# Patient Record
Sex: Female | Born: 1980 | Race: Black or African American | Hispanic: No | Marital: Married | Smoking: Never smoker
Health system: Southern US, Community
[De-identification: ages and names within clinical notes are randomized; demographics above are authoritative.]

## PROBLEM LIST (undated history)

## (undated) ENCOUNTER — Inpatient Hospital Stay (HOSPITAL_COMMUNITY): Payer: Self-pay

## (undated) DIAGNOSIS — O24419 Gestational diabetes mellitus in pregnancy, unspecified control: Secondary | ICD-10-CM

## (undated) HISTORY — PX: NO PAST SURGERIES: SHX2092

## (undated) HISTORY — DX: Gestational diabetes mellitus in pregnancy, unspecified control: O24.419

---

## 2012-12-02 ENCOUNTER — Encounter (HOSPITAL_COMMUNITY): Payer: Self-pay | Admitting: *Deleted

## 2012-12-02 ENCOUNTER — Emergency Department (HOSPITAL_COMMUNITY)
Admission: EM | Admit: 2012-12-02 | Discharge: 2012-12-02 | Disposition: A | Discharge status: Home / Self Care | Payer: Medicaid Other | Attending: Emergency Medicine | Admitting: Emergency Medicine

## 2012-12-02 DIAGNOSIS — N898 Other specified noninflammatory disorders of vagina: Secondary | ICD-10-CM | POA: Insufficient documentation

## 2012-12-02 DIAGNOSIS — N949 Unspecified condition associated with female genital organs and menstrual cycle: Secondary | ICD-10-CM | POA: Insufficient documentation

## 2012-12-02 DIAGNOSIS — K5900 Constipation, unspecified: Secondary | ICD-10-CM | POA: Insufficient documentation

## 2012-12-02 DIAGNOSIS — R109 Unspecified abdominal pain: Secondary | ICD-10-CM | POA: Insufficient documentation

## 2012-12-02 DIAGNOSIS — O2390 Unspecified genitourinary tract infection in pregnancy, unspecified trimester: Secondary | ICD-10-CM | POA: Insufficient documentation

## 2012-12-02 DIAGNOSIS — R300 Dysuria: Secondary | ICD-10-CM | POA: Insufficient documentation

## 2012-12-02 DIAGNOSIS — O9989 Other specified diseases and conditions complicating pregnancy, childbirth and the puerperium: Secondary | ICD-10-CM | POA: Insufficient documentation

## 2012-12-02 DIAGNOSIS — O2342 Unspecified infection of urinary tract in pregnancy, second trimester: Secondary | ICD-10-CM

## 2012-12-02 DIAGNOSIS — O219 Vomiting of pregnancy, unspecified: Secondary | ICD-10-CM | POA: Insufficient documentation

## 2012-12-02 DIAGNOSIS — N390 Urinary tract infection, site not specified: Secondary | ICD-10-CM | POA: Insufficient documentation

## 2012-12-02 LAB — URINALYSIS, MICROSCOPIC ONLY
Bilirubin Urine: NEGATIVE
Hgb urine dipstick: NEGATIVE
Protein, ur: 30 mg/dL — AB
Urobilinogen, UA: 0.2 mg/dL (ref 0.0–1.0)

## 2012-12-02 MED ORDER — NITROFURANTOIN MONOHYD MACRO 100 MG PO CAPS: 100.0000 mg | ORAL_CAPSULE | Freq: Two times a day (BID) | ORAL | Status: DC

## 2012-12-02 NOTE — ED Provider Notes (Signed)
Medical screening examination/treatment/procedure(s) were conducted as a shared visit with non-physician practitioner(s) and myself.  I personally evaluated the patient during the encounter.  G1P0 no vag bleed, constant non-crampy pain all day bilat lower abd at sides of uterus, minimally tender lower bilat uterus, RLQ and RUQ abd SNT, OBRR RN evaluated Pt, I discussed case with OB faculty for f/u either at health dept or Women's clinic.  Hurman Horn, MD 12/16/12 [phone]

## 2012-12-02 NOTE — Progress Notes (Signed)
Pt is a G1P0 (edc, by lmp, 7/29), 22wk 5da. FHT 160 by doppler, no contractions after monitoring x 1hr. Pt states scant amount of white discharge in the past, not leaking any fluid at this time, no bleeding. Will continue to assess for contractions. Describes primary pain in upper thighs, some back pain.

## 2012-12-02 NOTE — Progress Notes (Signed)
Pt confirms pcp is Kim Robertson updated

## 2012-12-02 NOTE — ED Notes (Signed)
Pt reports she is 5 months pregnant, no prenatal care. Pt reports feeling baby kick around 1200 today. Pt reports lower abdominal pain 8/10 that started this morning. Reports some whitish vaginal discharge last week. No discharge today. Denies n/v/d.

## 2012-12-02 NOTE — ED Notes (Signed)
Based on pt's LMP, pt is [redacted]w[redacted]d. OB RN called and en route.

## 2012-12-03 NOTE — ED Provider Notes (Signed)
History     CSN: [id number]  Arrival date & time 12/02/12  1654   First MD Initiated Contact with Patient 12/02/12 1747      Chief Complaint  Patient presents with  . 5 months preg, abdominal pain     (Consider location/radiation/quality/duration/timing/severity/associated sxs/prior treatment) HPI Comments: 32 y.o. Female G1P0 presents approx 5 months pregnant with no prenatal care complaining of minor abdominal pain that started this morning and vaginal discharge occurring over the past week.   OB Rapid Response Team evaluation included approximating gestation at 23 weeks, reassuring NST with fetal heart rate at 160. No contractions appreciated on strip.   Pt admits some nausea and constipation throughout her pregnancy, not worse today, and admits some mild burning with urination. States she has been feeling the baby move.  Denies fever, chills, vomiting, diarrhea, gush of fluids, bloody discharge, shortness of breath, or feeling contractions.   History reviewed. No pertinent past medical history.  No past surgical history on file.  No family history on file.  History  Substance Use Topics  . Smoking status: Never Smoker   . Smokeless tobacco: Not on file  . Alcohol Use: No    OB History   Grav Para Term Preterm Abortions TAB SAB Ect Mult Living   1               Review of Systems  Constitutional: Negative for fever, chills and diaphoresis.  HENT: Negative for neck pain and neck stiffness.   Eyes: Negative for visual disturbance.  Respiratory: Negative for apnea, chest tightness and shortness of breath.   Cardiovascular: Negative for chest pain and palpitations.  Gastrointestinal: Negative for nausea, vomiting, diarrhea and constipation.  Genitourinary: Positive for dysuria, vaginal discharge and pelvic pain. Negative for hematuria, flank pain, vaginal bleeding and vaginal pain.       Bilateral groin pain, minimal whitish discharge noted in underwear over the past  week  Musculoskeletal: Negative for gait problem.  Neurological: Negative for dizziness, weakness, light-headedness, numbness and headaches.    Allergies  Other  Home Medications   Current Outpatient Rx  Name  Route  Sig  Dispense  Refill  . Prenatal Vit-Fe Fumarate-FA (MULTIVITAMIN-PRENATAL) 27-0.8 MG TABS   Oral   Take 1 tablet by mouth daily at 12 noon.         . nitrofurantoin, macrocrystal-monohydrate, (MACROBID) 100 MG capsule   Oral   Take 1 capsule (100 mg total) by mouth 2 (two) times daily.   10 capsule   0     BP 118/64  Pulse 89  Temp(Src) 99.5 F (37.5 C) (Oral)  Resp 18  SpO2 98%  LMP 06/26/2012  Physical Exam  Nursing note and vitals reviewed. Constitutional: She is oriented to person, place, and time. She appears well-developed and well-nourished. No distress.  HENT:  Head: Normocephalic and atraumatic.  Eyes: Conjunctivae and EOM are normal.  Neck: Normal range of motion. Neck supple.  No meningeal signs  Cardiovascular: Normal rate, regular rhythm and normal heart sounds.  Exam reveals no gallop and no friction rub.   No murmur heard. Pulmonary/Chest: Effort normal and breath sounds normal. No respiratory distress. She has no wheezes. She has no rales. She exhibits no tenderness.  Abdominal: Soft. Bowel sounds are normal. She exhibits no distension. There is no tenderness. There is no rebound and no guarding.  Musculoskeletal: Normal range of motion. She exhibits no edema and no tenderness.  Neurological: She is alert and oriented to person,  place, and time. No cranial nerve deficit.  Skin: Skin is warm and dry. She is not diaphoretic. No erythema.  Psychiatric: She has a normal mood and affect.    ED Course  Procedures (including critical care time)  Labs Reviewed  URINALYSIS, MICROSCOPIC ONLY - Abnormal; Notable for the following:    APPearance CLOUDY (*)    Protein, ur 30 (*)    Leukocytes, UA LARGE (*)    Bacteria, UA FEW (*)     Squamous Epithelial / LPF FEW (*)    All other components within normal limits  URINE CULTURE   No results found. Medications - No data to display Discharge Medication List as of 12/02/2012  8:05 PM    START taking these medications   Details  nitrofurantoin, macrocrystal-monohydrate, (MACROBID) 100 MG capsule Take 1 capsule (100 mg total) by mouth 2 (two) times daily., Starting 12/02/2012, Until Discontinued, Print        1. Urinary tract infection during pregnancy, second trimester       MDM  Based on ROS and PE, likely typical ligamentous pain associated with second and third trimester pregnancy. Whitish discharge sounds typical of mucous discharge also associated with pregnancy. UA shows large leukocytes, will treat with Macrobid. OB Rapid Response Team findings reassuring. Pt also seen by Dr. Fonnie Jarvis who discussed case with OB faculty for f/u either at health dept or Women's clinic.  At this time there does not appear to be any evidence of an acute emergency medical condition and the patient appears stable for discharge with appropriate outpatient follow up. Diagnosis was discussed with patient who verbalizes understanding and is agreeable to discharge.    Glade Nurse, PA-C 12/03/12 1338

## 2012-12-04 LAB — URINE CULTURE
Colony Count: NO GROWTH
Culture: NO GROWTH

## 2012-12-09 ENCOUNTER — Encounter (HOSPITAL_COMMUNITY): Payer: Self-pay | Admitting: *Deleted

## 2012-12-09 ENCOUNTER — Inpatient Hospital Stay (HOSPITAL_COMMUNITY): Payer: Medicaid Other

## 2012-12-09 ENCOUNTER — Inpatient Hospital Stay (HOSPITAL_COMMUNITY)
Admission: AD | Admit: 2012-12-09 | Discharge: 2012-12-16 | DRG: 775 | Disposition: A | Discharge status: Home / Self Care | Payer: Medicaid Other | Source: Ambulatory Visit | Attending: Obstetrics and Gynecology | Admitting: Obstetrics and Gynecology

## 2012-12-09 DIAGNOSIS — O0933 Supervision of pregnancy with insufficient antenatal care, third trimester: Secondary | ICD-10-CM

## 2012-12-09 DIAGNOSIS — O42912 Preterm premature rupture of membranes, unspecified as to length of time between rupture and onset of labor, second trimester: Secondary | ICD-10-CM

## 2012-12-09 DIAGNOSIS — O0930 Supervision of pregnancy with insufficient antenatal care, unspecified trimester: Secondary | ICD-10-CM

## 2012-12-09 DIAGNOSIS — O4290 Premature rupture of membranes, unspecified as to length of time between rupture and onset of labor, unspecified weeks of gestation: Principal | ICD-10-CM | POA: Diagnosis present

## 2012-12-09 LAB — URINALYSIS, ROUTINE W REFLEX MICROSCOPIC
Bilirubin Urine: NEGATIVE
Glucose, UA: 500 mg/dL — AB
Specific Gravity, Urine: 1.025 (ref 1.005–1.030)
Urobilinogen, UA: 1 mg/dL (ref 0.0–1.0)

## 2012-12-09 LAB — URINE MICROSCOPIC-ADD ON

## 2012-12-09 LAB — WET PREP, GENITAL
Trich, Wet Prep: NONE SEEN
Yeast Wet Prep HPF POC: NONE SEEN

## 2012-12-09 LAB — DIFFERENTIAL
Basophils Absolute: 0 10*3/uL (ref 0.0–0.1)
Basophils Relative: 0 % (ref 0–1)
Eosinophils Relative: 1 % (ref 0–5)
Monocytes Absolute: 0.7 10*3/uL (ref 0.1–1.0)

## 2012-12-09 LAB — POCT FERN TEST: POCT Fern Test: POSITIVE

## 2012-12-09 LAB — CBC
HCT: 36 % (ref 36.0–46.0)
MCHC: 33.3 g/dL (ref 30.0–36.0)
MCV: 84.7 fL (ref 78.0–100.0)
RDW: 14.1 % (ref 11.5–15.5)

## 2012-12-09 LAB — TYPE AND SCREEN: Antibody Screen: NEGATIVE

## 2012-12-09 LAB — OB RESULTS CONSOLE ABO/RH

## 2012-12-09 LAB — ABO/RH: ABO/RH(D): B POS

## 2012-12-09 LAB — OB RESULTS CONSOLE GBS: GBS: NEGATIVE

## 2012-12-09 MED ORDER — ERYTHROMYCIN BASE 250 MG PO TABS
250.0000 mg | ORAL_TABLET | Freq: Four times a day (QID) | ORAL | Status: DC
  Administered 2012-12-11 – 2012-12-12 (×3): 250 mg via ORAL
  Filled 2012-12-09 (×4): qty 1

## 2012-12-09 MED ORDER — PRENATAL MULTIVITAMIN CH
1.0000 | ORAL_TABLET | Freq: Every day | ORAL | Status: DC
  Administered 2012-12-10 – 2012-12-13 (×3): 1 via ORAL
  Filled 2012-12-09 (×3): qty 1

## 2012-12-09 MED ORDER — SODIUM CHLORIDE 0.9 % IV SOLN: 250.0000 mL | INTRAVENOUS | Status: DC | PRN

## 2012-12-09 MED ORDER — SODIUM CHLORIDE 0.9 % IV SOLN
2.0000 g | Freq: Four times a day (QID) | INTRAVENOUS | Status: AC
  Administered 2012-12-09 – 2012-12-11 (×8): 2 g via INTRAVENOUS
  Filled 2012-12-09 (×8): qty 2000

## 2012-12-09 MED ORDER — DOCUSATE SODIUM 100 MG PO CAPS
100.0000 mg | ORAL_CAPSULE | Freq: Every day | ORAL | Status: DC
  Administered 2012-12-10: 100 mg via ORAL
  Filled 2012-12-09 (×2): qty 1

## 2012-12-09 MED ORDER — BETAMETHASONE SOD PHOS & ACET 6 (3-3) MG/ML IJ SUSP
12.0000 mg | INTRAMUSCULAR | Status: AC
  Administered 2012-12-09 – 2012-12-10 (×2): 12 mg via INTRAMUSCULAR
  Filled 2012-12-09 (×2): qty 2

## 2012-12-09 MED ORDER — SODIUM CHLORIDE 0.9 % IJ SOLN: 3.0000 mL | Freq: Two times a day (BID) | INTRAMUSCULAR | Status: DC

## 2012-12-09 MED ORDER — ZOLPIDEM TARTRATE 5 MG PO TABS: 5.0000 mg | ORAL_TABLET | Freq: Every evening | ORAL | Status: DC | PRN

## 2012-12-09 MED ORDER — CALCIUM CARBONATE ANTACID 500 MG PO CHEW: 2.0000 | CHEWABLE_TABLET | ORAL | Status: DC | PRN

## 2012-12-09 MED ORDER — ACETAMINOPHEN 325 MG PO TABS: 650.0000 mg | ORAL_TABLET | ORAL | Status: DC | PRN

## 2012-12-09 MED ORDER — SODIUM CHLORIDE 0.9 % IJ SOLN: 3.0000 mL | INTRAMUSCULAR | Status: DC | PRN

## 2012-12-09 MED ORDER — SODIUM CHLORIDE 0.9 % IV SOLN
250.0000 mg | Freq: Four times a day (QID) | INTRAVENOUS | Status: AC
  Administered 2012-12-09 – 2012-12-11 (×8): 250 mg via INTRAVENOUS
  Filled 2012-12-09 (×8): qty 250

## 2012-12-09 MED ORDER — LACTATED RINGERS IV SOLN
INTRAVENOUS | Status: DC
  Administered 2012-12-09 – 2012-12-12 (×3): via INTRAVENOUS

## 2012-12-09 MED ORDER — AMOXICILLIN 500 MG PO CAPS
500.0000 mg | ORAL_CAPSULE | Freq: Three times a day (TID) | ORAL | Status: DC
  Administered 2012-12-11 – 2012-12-12 (×2): 500 mg via ORAL
  Filled 2012-12-09 (×3): qty 1

## 2012-12-09 NOTE — Progress Notes (Signed)
UR completed 

## 2012-12-09 NOTE — H&P (Signed)
Attestation of Attending Supervision of Advanced Practitioner (CNM/NP): Evaluation and management procedures were performed by the Advanced Practitioner under my supervision and collaboration.  I have reviewed the Advanced Practitioner's note and chart, and I agree with the management and plan.  HARRAWAY-SMITH, Seydina Holliman 7:10 PM

## 2012-12-09 NOTE — Consult Note (Signed)
Asked by Dr. Erin Fulling to provide prenatal consultation for patient at risk for preterm delivery due to PPROM.  Mother is 32 y.o. G1 who is now 74 6/[redacted]  weeks EGA by LMP, but she has not had prenatal care and [place] today suggests [redacted] wks EGA.  She was admitted today after PROM at home with clear fluid.  No fever or other Sx of infection (labs pending).  She is being treated with betamethasone and antibiotics (ampicillin and erythromycin) .  Discussed with patient and her husband the usual expectations for preterm infant at 56 - [redacted] weeks gestation, including possible needs for DR resuscitation, respiratory support, IV access, and blood products.  Also presented risks of death or serious morbidity, including neurodevelopmental disability.  Projected possible length of stay in NICU until 36 - [redacted] wks EGA.  Discussed advantages of feeding with mother's milk; she plans to pump postnatally and breast feed.  Patient was attentive, had appropriate questions, and was appreciative of my input.  Thank you for the consultation.

## 2012-12-09 NOTE — MAU Note (Signed)
?  SROM this morning around 1100, had gush of clear fluid, continues to trickle now.  Denies bleeding.  Pelvic pain, intermittent.

## 2012-12-09 NOTE — Progress Notes (Signed)
History     CSN: [id number]  Arrival date & time 12/09/12  1210   None     Chief Complaint  Patient presents with  . Rupture of Membranes  . Pelvic Pain    HPI Presenting for leaking of  Fluid at 11:00. Felt fluid coming out while sitting in a chair. Pt states it was about a handful. Watery and clear. Denies recent vaginal discharge. Last intercourse last Saturday. Denies vagnial bleeding/discharge, contractions, RUQ pain, HA, LE swelling, dysuria, N/V/D/C, lightheadedness, syncope.  No prenatal care to date. Has appt with Dr. Gaynell Face coming up. First pregnancy.     Past Medical History  Diagnosis Date  . Medical history non-contributory     Past Surgical History  Procedure Laterality Date  . No past surgeries      History reviewed. No pertinent family history.  History  Substance Use Topics  . Smoking status: Never Smoker   . Smokeless tobacco: Not on file  . Alcohol Use: No    OB History   Grav Para Term Preterm Abortions TAB SAB Ect Mult Living   1               Review of Systems  Constitutional: Negative for fever and activity change.  Respiratory: Negative for shortness of breath.   Cardiovascular: Negative for chest pain.  Gastrointestinal: Negative for nausea, vomiting, abdominal pain, diarrhea and constipation.  Genitourinary: Negative for dysuria, vaginal bleeding, difficulty urinating and vaginal pain.  Skin: Negative for rash.  Neurological: Positive for headaches. Negative for light-headedness.  All other systems reviewed and are negative.    Allergies  Other  Home Medications  No current outpatient prescriptions on file.  BP 115/61  Pulse 80  Temp(Src) 97.2 F (36.2 C) (Oral)  Resp 18  LMP 06/26/2012  Physical Exam  Constitutional: She is oriented to person, place, and time. She appears well-developed and well-nourished. No distress.  HENT:  Head: Normocephalic.  Eyes: Conjunctivae and EOM are normal.  Neck: Normal range of  motion.  Cardiovascular: Normal rate.   Pulmonary/Chest: Effort normal.  Abdominal: Soft.  Genitourinary: Vagina normal.  Pooling present on speculum exam. Cervix closed  Musculoskeletal: Normal range of motion.  Neurological: She is alert and oriented to person, place, and time.  Skin: Skin is warm and dry. She is not diaphoretic.  Psychiatric: She has a normal mood and affect. Her behavior is normal.    MAU Course  Procedures (including critical care time)  Labs Reviewed  URINALYSIS, ROUTINE W REFLEX MICROSCOPIC - Abnormal; Notable for the following:    APPearance HAZY (*)    Glucose, UA 500 (*)    Hgb urine dipstick TRACE (*)    Leukocytes, UA SMALL (*)    All other components within normal limits  URINE MICROSCOPIC-ADD ON - Abnormal; Notable for the following:    Squamous Epithelial / LPF FEW (*)    All other components within normal limits   US Ob Comp + 14 Wk  12/09/2012  OBSTETRICAL ULTRASOUND: This exam was performed within a [facility]. The OB US report was generated in the [facility] system, and faxed to the ordering physician.   This report is also available in [employer] and in the [employer]. See [facility] Obstetric US report.   Cephalic, AFI 8.54, Cervix 2.5 cm, placenta fundal, GA 27.0 weeks.  1. Preterm premature rupture of membranes in second trimester       MDM  32yo G1 at 23.6wks  presenting for concern for ROM. No PNC but has appt w/ Dr. Gaynell Face on Monday. No contractions on the monitor and FHT reassuring. - Cervical check  for ferning - wet prep, GC, Chl sent   ---------------------------------- Update  Ferning present on microscopic examination.  Reconfirmed dating Admit for steroids and [place] for dating, fluid levels, and anatomy    I saw and examined patient and agree with resident note. See History and Physical today's date. Napoleon Form, MD

## 2012-12-09 NOTE — H&P (Signed)
Kim Robertson is a 32 y.o. G1P0 at [redacted]w[redacted]d admitted for PPROM    Fetal presentation is unsure.  History of Present Illness: Presenting for leaking of Fluid at 11:00 am. Felt fluid coming out while sitting in a chair. Pt states it was about a handful. Thick and clear. Denies recent vaginal discharge. Last intercourse last Saturday. Denies vagnial bleeding/discharge, contractions, RUQ pain, HA, LE swelling, dysuria, N/V/D/C, lightheadedness, syncope.  Patient reports the fetal movement as active. Patient reports uterine contraction  activity as none. Patient reports  vaginal bleeding as none. Patient describes fluid per vagina as Clear.  Patient has had no prenatal care to date. She is scheduled to see Dr. Gaynell Face. First pregnancy, no OB history. LMP last week of October, ended before Halloween but not sure of exact dates. Periods regular, usually last 3-4 days.  Past Medical History: Past Medical History  Diagnosis Date  . Medical history non-contributory     Past Surgical History: Past Surgical History  Procedure Laterality Date  . No past surgeries      Obstetrical History: OB History   Grav Para Term Preterm Abortions TAB SAB Ect Mult Living   1              Gynecological History: negative  Social History: History   Social History  . Marital Status: Married    Spouse Name: N/A    Number of Children: N/A  . Years of Education: N/A   Social History Main Topics  . Smoking status: Never Smoker   . Smokeless tobacco: None  . Alcohol Use: No  . Drug Use: No  . Sexually Active: Yes   Other Topics Concern  . None   Social History Narrative  . None    Family History: History reviewed. No pertinent family history.  Allergies: Allergies  Allergen Reactions  . Other     Nasal spray.  Reaction unknown.   . Neosporin (Neomycin-Bacitracin Zn-Polymyx) Rash    Prescriptions prior to admission  Medication Sig Dispense Refill  . acetaminophen (TYLENOL) 500 MG  tablet Take 1,000 mg by mouth every 6 (six) hours as needed for pain.      . Prenatal Vit-Fe Fumarate-FA (MULTIVITAMIN-PRENATAL) 27-0.8 MG TABS Take 1 tablet by mouth daily at 12 noon.        Review of Systems  Constitutional: Negative for fever and activity change.  Respiratory: Negative for shortness of breath.  Cardiovascular: Negative for chest pain.  Gastrointestinal: Negative for nausea, vomiting, abdominal pain, diarrhea and constipation.  Genitourinary: Negative for dysuria, vaginal bleeding, difficulty urinating and vaginal pain.  Skin: Negative for rash.  Neurological: Positive for headaches. Negative for light-headedness.  All other systems reviewed and are negative.   Vitals:  BP 115/61  Pulse 80  Temp(Src) 97.2 F (36.2 C) (Oral)  Resp 18  LMP 06/26/2012 Physical Examination: Constitutional: She is oriented to person, place, and time. She appears well-developed and well-nourished. No distress.  HENT:  Head: Normocephalic.  Eyes: Conjunctivae and EOM are normal.  Neck: Normal range of motion.  Cardiovascular: Normal rate.  Pulmonary/Chest: Effort normal.  Abdominal: Soft.  Genitourinary: Vagina normal.  Pooling present on speculum exam. Cervix closed  Musculoskeletal: Normal range of motion.  Neurological: She is alert and oriented to person, place, and time.  Skin: Skin is warm and dry. She is not diaphoretic.  Psychiatric: She has a normal mood and affect. Her behavior is normal Membranes:sterile speculum exam clear pooling in the vagina w/ a closed cervix, no  lesions or purulent discharge Fetal Monitoring:Baseline: 140 bpm, Variability: Good {> 6 bpm), Accelerations: Reactive and Decelerations: Absent   Labs:  Results for orders placed during the hospital encounter of 12/09/12 (from the past 24 hour(s))  URINALYSIS, ROUTINE W REFLEX MICROSCOPIC   Collection Time    12/09/12 12:30 PM      Result Value Range   Color, Urine YELLOW  YELLOW   APPearance HAZY  (*) CLEAR   Specific Gravity, Urine 1.025  1.005 - 1.030   pH 6.5  5.0 - 8.0   Glucose, UA 500 (*) NEGATIVE mg/dL   Hgb urine dipstick TRACE (*) NEGATIVE   Bilirubin Urine NEGATIVE  NEGATIVE   Ketones, ur NEGATIVE  NEGATIVE mg/dL   Protein, ur NEGATIVE  NEGATIVE mg/dL   Urobilinogen, UA 1.0  0.0 - 1.0 mg/dL   Nitrite NEGATIVE  NEGATIVE   Leukocytes, UA SMALL (*) NEGATIVE  URINE MICROSCOPIC-ADD ON   Collection Time    12/09/12 12:30 PM      Result Value Range   Squamous Epithelial / LPF FEW (*) RARE   WBC, UA 3-6  <3 WBC/hpf   RBC / HPF 0-2  <3 RBC/hpf   Bacteria, UA RARE  RARE  WET PREP, GENITAL   Collection Time    12/09/12  1:30 PM      Result Value Range   Yeast Wet Prep HPF POC NONE SEEN  NONE SEEN   Trich, Wet Prep NONE SEEN  NONE SEEN   Clue Cells Wet Prep HPF POC NONE SEEN  NONE SEEN   WBC, Wet Prep HPF POC MODERATE (*) NONE SEEN  POCT FERN TEST   Collection Time    12/09/12  2:07 PM      Result Value Range   POCT Fern Test Positive = ruptured amniotic membanes      Imaging Studies:      . ampicillin (OMNIPEN) IV  2 g Intravenous Q6H   Followed by  . [START ON 12/11/2012] amoxicillin  500 mg Oral Q8H  . betamethasone acetate-betamethasone sodium phosphate  12 mg Intramuscular Q24H  . docusate sodium  100 mg Oral Daily  . erythromycin  250 mg Intravenous Q6H   Followed by  . [START ON 12/11/2012] erythromycin  250 mg Oral Q6H  . prenatal multivitamin  1 tablet Oral Q1200  . sodium chloride  3 mL Intravenous Q12H   I have reviewed the patient's current medications.  ASSESSMENT: 32yo G1 at 23.6wks by LMP presenting w/ PPROM adn no PNC. No contractions on the monitor and FHT reassuring. - Admit to Antenatal - Betamethasone - Amp and Erithro - Prenatal labs ordered - NICU consult - Discussed risks of preterm labor with patient, understands she is likely here until delivery.    I have seen and examined patient and agree with above resident note. I reviewed  labs, vital signs, imaging, and fetal heart tracing (reactive). Napoleon Form, MD

## 2012-12-10 DIAGNOSIS — O4290 Premature rupture of membranes, unspecified as to length of time between rupture and onset of labor, unspecified weeks of gestation: Secondary | ICD-10-CM | POA: Diagnosis present

## 2012-12-10 DIAGNOSIS — O0930 Supervision of pregnancy with insufficient antenatal care, unspecified trimester: Secondary | ICD-10-CM

## 2012-12-10 LAB — GC/CHLAMYDIA PROBE AMP: CT Probe RNA: NEGATIVE

## 2012-12-10 NOTE — Progress Notes (Signed)
Patient ID: Kim Robertson, female   DOB: November 23, 1980, 32 y.o.   MRN: [id number]  FACULTY PRACTICE ANTEPARTUM COMPREHENSIVE PROGRESS NOTE  Kim Robertson is a 32 y.o. G1P0 at [redacted]w[redacted]d who is admitted for rupture of membranes.   Estimated Date of Delivery: 03/10/13 by 27 week ultrasound (not consistent with reported LMP) Fetal presentation is cephalic by ultrasound 12/09/12.  Length of Stay:  1 day, ADMIT date 12/09/12  Subjective: Patient reports good fetal movement.  She reports mild (3/10 pain) uterine cramping q 15-20 minutes starting around 6 am today (30 minutes), no bleeding. Continues to have small amount of fluid per vagina. No fever/chills. No headache, vision changes, nausea or vomiting.  Vitals:   Filed Vitals:   12/09/12 2324  BP: 92/47  Pulse: 88  Temp: 98 F (36.7 C)  Resp: 18    Physical Examination: General appearance - alert, well appearing, and in no distress Chest - clear to auscultation, no wheezes, rales or rhonchi, symmetric air entry Heart - normal rate, regular rhythm, normal S1, S2, no murmurs, rubs, clicks or gallops Abdomen - soft, non-tender, no guarding or rebound, gravid (size appropriate for dates) Neurological - alert, oriented, normal speech, no focal findings or movement disorder noted Extremities - peripheral pulses normal, no pedal edema, no clubbing or cyanosis Cervical Exam: Not evaluated. Membranes:  ruptured  Fetal Monitoring:  Baseline: 145 bpm, Variability: moderate  Accelerations: Reactive and Decelerations: Variable: mild  Labs:  Results for orders placed during the hospital encounter of 12/09/12 (from the past 24 hour(s))  URINALYSIS, ROUTINE W REFLEX MICROSCOPIC     Status: Abnormal   Collection Time    12/09/12 12:30 PM      Result Value Range   Color, Urine YELLOW  YELLOW   APPearance HAZY (*) CLEAR   Specific Gravity, Urine 1.025  1.005 - 1.030   pH 6.5  5.0 - 8.0   Glucose, UA 500 (*) NEGATIVE mg/dL   Hgb urine dipstick TRACE (*)  NEGATIVE   Bilirubin Urine NEGATIVE  NEGATIVE   Ketones, ur NEGATIVE  NEGATIVE mg/dL   Protein, ur NEGATIVE  NEGATIVE mg/dL   Urobilinogen, UA 1.0  0.0 - 1.0 mg/dL   Nitrite NEGATIVE  NEGATIVE   Leukocytes, UA SMALL (*) NEGATIVE  URINE MICROSCOPIC-ADD ON     Status: Abnormal   Collection Time    12/09/12 12:30 PM      Result Value Range   Squamous Epithelial / LPF FEW (*) RARE   WBC, UA 3-6  <3 WBC/hpf   RBC / HPF 0-2  <3 RBC/hpf   Bacteria, UA RARE  RARE  WET PREP, GENITAL     Status: Abnormal   Collection Time    12/09/12  1:30 PM      Result Value Range   Yeast Wet Prep HPF POC NONE SEEN  NONE SEEN   Trich, Wet Prep NONE SEEN  NONE SEEN   Clue Cells Wet Prep HPF POC NONE SEEN  NONE SEEN   WBC, Wet Prep HPF POC MODERATE (*) NONE SEEN  HEPATITIS B SURFACE ANTIGEN     Status: None   Collection Time    12/09/12  2:05 PM      Result Value Range   Hepatitis B Surface Ag NEGATIVE  NEGATIVE  RPR     Status: None   Collection Time    12/09/12  2:05 PM      Result Value Range   RPR NON REACTIVE  NON REACTIVE  CBC  Status: None   Collection Time    12/09/12  2:05 PM      Result Value Range   WBC 9.3  4.0 - 10.5 K/uL   RBC 4.25  3.87 - 5.11 MIL/uL   Hemoglobin 12.0  12.0 - 15.0 g/dL   HCT 78.4  69.6 - 29.5 %   MCV 84.7  78.0 - 100.0 fL   MCH 28.2  26.0 - 34.0 pg   MCHC 33.3  30.0 - 36.0 g/dL   RDW 28.4  13.2 - 44.0 %   Platelets 197  150 - 400 K/uL  DIFFERENTIAL     Status: None   Collection Time    12/09/12  2:05 PM      Result Value Range   Neutrophils Relative 72  43 - 77 %   Neutro Abs 6.8  1.7 - 7.7 K/uL   Lymphocytes Relative 19  12 - 46 %   Lymphs Abs 1.8  0.7 - 4.0 K/uL   Monocytes Relative 7  3 - 12 %   Monocytes Absolute 0.7  0.1 - 1.0 K/uL   Eosinophils Relative 1  0 - 5 %   Eosinophils Absolute 0.1  0.0 - 0.7 K/uL   Basophils Relative 0  0 - 1 %   Basophils Absolute 0.0  0.0 - 0.1 K/uL  HIV ANTIBODY (ROUTINE TESTING)     Status: None   Collection  Time    12/09/12  2:05 PM      Result Value Range   HIV NON REACTIVE  NON REACTIVE  TYPE AND SCREEN     Status: None   Collection Time    12/09/12  2:05 PM      Result Value Range   ABO/RH(D) B POS     Antibody Screen NEG     Sample Expiration 12/12/2012    ABO/RH     Status: None   Collection Time    12/09/12  2:05 PM      Result Value Range   ABO/RH(D) B POS    POCT FERN TEST     Status: None   Collection Time    12/09/12  2:07 PM      Result Value Range   POCT Fern Test Positive = ruptured amniotic membanes    GROUP B STREP BY PCR     Status: None   Collection Time    12/09/12  2:40 PM      Result Value Range   Group B strep by PCR NEGATIVE  NEGATIVE     Imaging Studies:    Cephalic, AFI 8.54, EDD 03/10/13, normal anatomy  Medications:  Scheduled . ampicillin (OMNIPEN) IV  2 g Intravenous Q6H   Followed by  . [START ON 12/11/2012] amoxicillin  500 mg Oral Q8H  . betamethasone acetate-betamethasone sodium phosphate  12 mg Intramuscular Q24H  . docusate sodium  100 mg Oral Daily  . erythromycin  250 mg Intravenous Q6H   Followed by  . [START ON 12/11/2012] erythromycin  250 mg Oral Q6H  . prenatal multivitamin  1 tablet Oral Q1200   I have reviewed current schedued medications.  ASSESSMENT: 32 y.o. G1P0 at [redacted]w[redacted]d with - PPROM  PLAN: IUP: NST reactive NICU consult done Continue antibiotics and complete BMZ today at 2:40 PM Monitor for fever, contractions, signs of infection, fetal wellbeing, signs of preterm labor Continue routine antenatal care.  Napoleon Form, MD

## 2012-12-10 NOTE — Progress Notes (Signed)
12/10/12 1500  Clinical Encounter Type  Visited With Patient  Visit Type Initial;Spiritual support;Social support  Referral From Nurse  Spiritual Encounters  Spiritual Needs Emotional   Kim Robertson was in good spirits during this initial visit, citing support from her mom and from God as helpful in her adjusting to her surprise PROM and admission.  She reports good support from her husband and from her family in [place]; at this time she desires quiet, private space before family comes to visit (maybe this weekend).  She shared some about her family, and we talked about coping with unexpected change.  She is active in a church community and has appreciated a visit from her pastor.    Provided intro to spiritual care and chaplain availability, as well as pastoral listening and encouragement.  Will follow for support.  [address] [place], [place] [phone]

## 2012-12-10 NOTE — Progress Notes (Signed)
Patient ID: Kim Robertson, female   DOB: 09-30-1980, 30 y.o.   MRN: [id number] ACULTY PRACTICE ANTEPARTUM COMPREHENSIVE PROGRESS NOTE  Kim Robertson is a 32 y.o. G1P0 at [redacted]w[redacted]d  who is admitted for PROM.   Fetal presentation is cephalic. Length of Stay:  1  Days  Subjective: Pt with no complaints.  Occ very mild ctx  Patient reports good fetal movement.  She reports min uterine contractions, no bleeding but, +loss of fluid per vagina.  Vitals:  Blood pressure 92/47, pulse 88, temperature 98 F (36.7 C), temperature source Oral, resp. rate 18, height 5\' 3"  (1.6 m), weight 185 lb (83.915 kg), last menstrual period 06/26/2012. Physical Examination: General appearance - alert, well appearing, and in no distress, oriented to person, place, and time and normal appearing weight Mental status - alert, oriented to person, place, and time, normal mood, behavior, speech, dress, motor activity, and thought processes Abdomen - soft, nontender, nondistended, no masses or organomegaly gravid Membranes:intact, ruptured  Fetal Monitoring:  Baseline: 150's bpm, Variability: Good {> 6 bpm) and Accelerations: Reactive  Labs:  Results for orders placed during the hospital encounter of 12/09/12 (from the past 24 hour(s))  URINALYSIS, ROUTINE W REFLEX MICROSCOPIC   Collection Time    12/09/12 12:30 PM      Result Value Range   Color, Urine YELLOW  YELLOW   APPearance HAZY (*) CLEAR   Specific Gravity, Urine 1.025  1.005 - 1.030   pH 6.5  5.0 - 8.0   Glucose, UA 500 (*) NEGATIVE mg/dL   Hgb urine dipstick TRACE (*) NEGATIVE   Bilirubin Urine NEGATIVE  NEGATIVE   Ketones, ur NEGATIVE  NEGATIVE mg/dL   Protein, ur NEGATIVE  NEGATIVE mg/dL   Urobilinogen, UA 1.0  0.0 - 1.0 mg/dL   Nitrite NEGATIVE  NEGATIVE   Leukocytes, UA SMALL (*) NEGATIVE  URINE MICROSCOPIC-ADD ON   Collection Time    12/09/12 12:30 PM      Result Value Range   Squamous Epithelial / LPF FEW (*) RARE   WBC, UA 3-6  <3 WBC/hpf    RBC / HPF 0-2  <3 RBC/hpf   Bacteria, UA RARE  RARE  WET PREP, GENITAL   Collection Time    12/09/12  1:30 PM      Result Value Range   Yeast Wet Prep HPF POC NONE SEEN  NONE SEEN   Trich, Wet Prep NONE SEEN  NONE SEEN   Clue Cells Wet Prep HPF POC NONE SEEN  NONE SEEN   WBC, Wet Prep HPF POC MODERATE (*) NONE SEEN  HEPATITIS B SURFACE ANTIGEN   Collection Time    12/09/12  2:05 PM      Result Value Range   Hepatitis B Surface Ag NEGATIVE  NEGATIVE  RPR   Collection Time    12/09/12  2:05 PM      Result Value Range   RPR NON REACTIVE  NON REACTIVE  CBC   Collection Time    12/09/12  2:05 PM      Result Value Range   WBC 9.3  4.0 - 10.5 K/uL   RBC 4.25  3.87 - 5.11 MIL/uL   Hemoglobin 12.0  12.0 - 15.0 g/dL   HCT 40.9  81.1 - 91.4 %   MCV 84.7  78.0 - 100.0 fL   MCH 28.2  26.0 - 34.0 pg   MCHC 33.3  30.0 - 36.0 g/dL   RDW 78.2  95.6 - 21.3 %  Platelets 197  150 - 400 K/uL  DIFFERENTIAL   Collection Time    12/09/12  2:05 PM      Result Value Range   Neutrophils Relative 72  43 - 77 %   Neutro Abs 6.8  1.7 - 7.7 K/uL   Lymphocytes Relative 19  12 - 46 %   Lymphs Abs 1.8  0.7 - 4.0 K/uL   Monocytes Relative 7  3 - 12 %   Monocytes Absolute 0.7  0.1 - 1.0 K/uL   Eosinophils Relative 1  0 - 5 %   Eosinophils Absolute 0.1  0.0 - 0.7 K/uL   Basophils Relative 0  0 - 1 %   Basophils Absolute 0.0  0.0 - 0.1 K/uL  HIV ANTIBODY (ROUTINE TESTING)   Collection Time    12/09/12  2:05 PM      Result Value Range   HIV NON REACTIVE  NON REACTIVE  TYPE AND SCREEN   Collection Time    12/09/12  2:05 PM      Result Value Range   ABO/RH(D) B POS     Antibody Screen NEG     Sample Expiration 12/12/2012    ABO/RH   Collection Time    12/09/12  2:05 PM      Result Value Range   ABO/RH(D) B POS    POCT FERN TEST   Collection Time    12/09/12  2:07 PM      Result Value Range   POCT Fern Test Positive = ruptured amniotic membanes    GROUP B STREP BY PCR   Collection Time     12/09/12  2:40 PM      Result Value Range   Group B strep by PCR NEGATIVE  NEGATIVE    Imaging Studies:    Sono: subjectively low fluid; vertex; 27 weeks  Medications:  Scheduled . ampicillin (OMNIPEN) IV  2 g Intravenous Q6H   Followed by  . [START ON 12/11/2012] amoxicillin  500 mg Oral Q8H  . betamethasone acetate-betamethasone sodium phosphate  12 mg Intramuscular Q24H  . docusate sodium  100 mg Oral Daily  . erythromycin  250 mg Intravenous Q6H   Followed by  . [START ON 12/11/2012] erythromycin  250 mg Oral Q6H  . prenatal multivitamin  1 tablet Oral Q1200   I have reviewed the patient's current medications.  ASSESSMENT: PROM @ 27 weeks Inadequate PNC There is no problem list on file for this patient.   PLAN: Antibiotics as ordered to improve latency Complete full course of BMZ Continue routine antenatal care. Watch for s/sx of infection Deliver for fetal or maternal distress  HARRAWAY-SMITH, Cavon Nicolls 12/10/2012,6:25 AM

## 2012-12-11 DIAGNOSIS — O0930 Supervision of pregnancy with insufficient antenatal care, unspecified trimester: Secondary | ICD-10-CM

## 2012-12-11 DIAGNOSIS — O4290 Premature rupture of membranes, unspecified as to length of time between rupture and onset of labor, unspecified weeks of gestation: Secondary | ICD-10-CM

## 2012-12-11 MED ORDER — SODIUM CHLORIDE 0.9 % IJ SOLN
3.0000 mL | Freq: Two times a day (BID) | INTRAMUSCULAR | Status: DC
  Administered 2012-12-11 – 2012-12-13 (×3): 3 mL via INTRAVENOUS

## 2012-12-11 NOTE — Progress Notes (Signed)
Patient ID: Kim Robertson, female   DOB: 23-Jan-1981, 32 y.o.   MRN: [id number] ACULTY PRACTICE ANTEPARTUM COMPREHENSIVE PROGRESS NOTE  Kim Robertson is a 32 y.o. G1P0 at [redacted]w[redacted]d  who is admitted for PROM.   Fetal presentation is cephalic. Length of Stay:  2  Days  Subjective: Pt with no complaints.   Patient reports good fetal movement and continued leakage of fluid.  She denies uterine contractions and vaginal bleeding.  Vitals:  Blood pressure 115/49, pulse 81, temperature 98.7 F (37.1 C), temperature source Oral, resp. rate 16, height 5\' 3"  (1.6 m), weight 83.915 kg (185 lb), last menstrual period 06/26/2012. Physical Examination: General appearance - alert, well appearing, and in no distress, oriented to person, place, and time and normal appearing weight Mental status - alert, oriented to person, place, and time, normal mood, behavior, speech, dress, motor activity, and thought processes Abdomen - soft, nontender, gravid Membranes:intact, ruptured  Fetal Monitoring:  Baseline: 150 bpm, Variability: Good {> 6 bpm), Accelerations: Non-reactive but appropriate for gestational age, Decelerations: Absent and Toco: no contractions  Labs:  No results found for this or any previous visit (from the past 24 hour(s)).  Imaging Studies:    Sono: subjectively low fluid; vertex; 27 weeks  Medications:  Scheduled . ampicillin (OMNIPEN) IV  2 g Intravenous Q6H   Followed by  . amoxicillin  500 mg Oral Q8H  . docusate sodium  100 mg Oral Daily  . erythromycin  250 mg Intravenous Q6H   Followed by  . erythromycin  250 mg Oral Q6H  . prenatal multivitamin  1 tablet Oral Q1200   I have reviewed the patient's current medications.  ASSESSMENT: PROM @ 27 weeks Inadequate PNC Patient Active Problem List  Diagnosis  . PROM (premature rupture of membranes)  . Insufficient prenatal care  s/p betamethasone  PLAN: Continue latentcy antibiotics latency Continue routine antenatal care. Watch  for s/sx of infection Deliver for fetal or maternal distress  Dilpreet Faires 12/11/2012,7:16 AM

## 2012-12-12 ENCOUNTER — Encounter (HOSPITAL_COMMUNITY): Payer: Self-pay | Admitting: *Deleted

## 2012-12-12 LAB — CBC WITH DIFFERENTIAL/PLATELET
HCT: 34.5 % — ABNORMAL LOW (ref 36.0–46.0)
Hemoglobin: 10.8 g/dL — ABNORMAL LOW (ref 12.0–15.0)
Lymphocytes Relative: 12 % (ref 12–46)
MCHC: 31.3 g/dL (ref 30.0–36.0)
Monocytes Absolute: 1.3 10*3/uL — ABNORMAL HIGH (ref 0.1–1.0)
Monocytes Relative: 7 % (ref 3–12)
Neutro Abs: 16 10*3/uL — ABNORMAL HIGH (ref 1.7–7.7)
WBC: 19.8 10*3/uL — ABNORMAL HIGH (ref 4.0–10.5)

## 2012-12-12 MED ORDER — ERYTHROMYCIN BASE 250 MG PO TBEC: 500.0000 mg | DELAYED_RELEASE_TABLET | Freq: Three times a day (TID) | ORAL | Status: DC

## 2012-12-12 MED ORDER — ERYTHROMYCIN BASE 250 MG PO TBEC
250.0000 mg | DELAYED_RELEASE_TABLET | Freq: Three times a day (TID) | ORAL | Status: DC
  Administered 2012-12-12 – 2012-12-13 (×4): 250 mg via ORAL
  Filled 2012-12-12 (×6): qty 1

## 2012-12-12 MED ORDER — LACTATED RINGERS IV SOLN
INTRAVENOUS | Status: DC
  Administered 2012-12-12 – 2012-12-14 (×3): via INTRAVENOUS

## 2012-12-12 MED ORDER — OXYTOCIN 40 UNITS IN LACTATED RINGERS INFUSION - SIMPLE MED: 62.5000 mL/h | INTRAVENOUS | Status: DC

## 2012-12-12 MED ORDER — FENTANYL CITRATE 0.05 MG/ML IJ SOLN: 50.0000 ug | INTRAMUSCULAR | Status: DC | PRN

## 2012-12-12 MED ORDER — MAGNESIUM SULFATE BOLUS VIA INFUSION
4.0000 g | Freq: Once | INTRAVENOUS | Status: DC
  Filled 2012-12-12: qty 500

## 2012-12-12 MED ORDER — IBUPROFEN 600 MG PO TABS: 600.0000 mg | ORAL_TABLET | Freq: Four times a day (QID) | ORAL | Status: DC | PRN

## 2012-12-12 MED ORDER — OXYTOCIN BOLUS FROM INFUSION: 500.0000 mL | INTRAVENOUS | Status: DC

## 2012-12-12 MED ORDER — CITRIC ACID-SODIUM CITRATE 334-500 MG/5ML PO SOLN: 30.0000 mL | ORAL | Status: DC | PRN

## 2012-12-12 MED ORDER — ONDANSETRON HCL 4 MG/2ML IJ SOLN: 4.0000 mg | Freq: Four times a day (QID) | INTRAMUSCULAR | Status: DC | PRN

## 2012-12-12 MED ORDER — AMOXICILLIN 500 MG PO CAPS
500.0000 mg | ORAL_CAPSULE | Freq: Three times a day (TID) | ORAL | Status: DC
  Administered 2012-12-12 – 2012-12-13 (×4): 500 mg via ORAL
  Filled 2012-12-12 (×6): qty 1

## 2012-12-12 MED ORDER — LACTATED RINGERS IV SOLN: 500.0000 mL | INTRAVENOUS | Status: DC | PRN

## 2012-12-12 MED ORDER — LIDOCAINE HCL (PF) 1 % IJ SOLN: 30.0000 mL | INTRAMUSCULAR | Status: DC | PRN

## 2012-12-12 MED ORDER — MAGNESIUM SULFATE 40 G IN LACTATED RINGERS - SIMPLE
2.0000 g/h | INTRAVENOUS | Status: DC
  Administered 2012-12-12: 8 g/h via INTRAVENOUS
  Filled 2012-12-12: qty 500

## 2012-12-12 MED ORDER — OXYCODONE-ACETAMINOPHEN 5-325 MG PO TABS: 1.0000 | ORAL_TABLET | ORAL | Status: DC | PRN

## 2012-12-12 MED ORDER — ACETAMINOPHEN 325 MG PO TABS: 650.0000 mg | ORAL_TABLET | ORAL | Status: DC | PRN

## 2012-12-12 NOTE — Progress Notes (Signed)
Pt transported to 162 via WC without incident.

## 2012-12-12 NOTE — Progress Notes (Signed)
Patient ID: Kim Robertson, female   DOB: 17-Jun-1981, 32 y.o.   MRN: [id number] Patient appears to be going into labor, difficult to pick up contractions.  FHR 150s Pt appears to be quite uncomfortablem with q5 minute contractions cx 2/100/0 vertex  Transfer to L&D for monitoring of labor Pt/husband aware of probable inevitability of labor  Kim Robertson H 12/12/2012 7:44 AM

## 2012-12-12 NOTE — Progress Notes (Signed)
Magnesium Sulfate started for CP prphylaxis.  Patient oriented to L+D.  Call bell within reach.

## 2012-12-12 NOTE — Progress Notes (Signed)
Report given to [facility], RN; pt. Ready for transfer to L & D room 162; all belonging with patient.

## 2012-12-12 NOTE — Progress Notes (Signed)
Plan of care to attempt to get 12 hours of magnesium on board for neuroprotection then possible transfer back to antenatal if contractions diminish.

## 2012-12-13 LAB — CBC WITH DIFFERENTIAL/PLATELET
Basophils Relative: 0 % (ref 0–1)
Eosinophils Absolute: 0.1 10*3/uL (ref 0.0–0.7)
Eosinophils Relative: 1 % (ref 0–5)
HCT: 32.8 % — ABNORMAL LOW (ref 36.0–46.0)
Hemoglobin: 10.6 g/dL — ABNORMAL LOW (ref 12.0–15.0)
MCH: 27.6 pg (ref 26.0–34.0)
MCHC: 32.3 g/dL (ref 30.0–36.0)
Monocytes Absolute: 1.5 10*3/uL — ABNORMAL HIGH (ref 0.1–1.0)
Monocytes Relative: 8 % (ref 3–12)

## 2012-12-13 MED ORDER — FENTANYL CITRATE 0.05 MG/ML IJ SOLN
100.0000 ug | INTRAMUSCULAR | Status: DC | PRN
  Administered 2012-12-13 – 2012-12-14 (×2): 100 ug via INTRAVENOUS
  Filled 2012-12-13 (×2): qty 2

## 2012-12-13 NOTE — Progress Notes (Signed)
Patient ID: Kim Robertson, female   DOB: 03-Mar-1981, 32 y.o.   MRN: [id number] FACULTY PRACTICE ANTEPARTUM(COMPREHENSIVE) NOTE  Kim Robertson is a 32 y.o. G1P0 at [redacted]w[redacted]d by best clinical estimate who is admitted for rupture of membranes.   Fetal presentation is cephalic. Length of Stay:  4  Days  Subjective: No contractions now, was observed on L&D for s/sx PTL yesterday Patient reports the fetal movement as active. Patient reports uterine contraction  activity as none. Patient reports  vaginal bleeding as none. Patient describes fluid per vagina as Clear.  Vitals:  Blood pressure 117/58, pulse 74, temperature 98.3 F (36.8 C), temperature source Oral, resp. rate 18, height 5\' 3"  (1.6 m), weight 187 lb (84.823 kg), last menstrual period 06/26/2012, SpO2 99.00%. Physical Examination:  General appearance - alert, well appearing, and in no distress Heart - normal rate and regular rhythm Abdomen - soft, nontender, nondistended Fundal Height:  size equals dates Cervical Exam: Not evaluated.  Extremities: extremities normal Membranes:ruptured, clear fluid  Fetal Monitoring:  Baseline: 150 bpm, Variability: Good {> 6 bpm), Accelerations: Reactive and Decelerations: Absent  Labs:  Results for orders placed during the hospital encounter of 12/09/12 (from the past 24 hour(s))  CBC WITH DIFFERENTIAL   Collection Time    12/12/12  9:35 AM      Result Value Range   WBC 19.8 (*) 4.0 - 10.5 K/uL   RBC 3.95  3.87 - 5.11 MIL/uL   Hemoglobin 10.8 (*) 12.0 - 15.0 g/dL   HCT 40.9 (*) 81.1 - 91.4 %   MCV 87.3  78.0 - 100.0 fL   MCH 27.3  26.0 - 34.0 pg   MCHC 31.3  30.0 - 36.0 g/dL   RDW 78.2  95.6 - 21.3 %   Platelets 237  150 - 400 K/uL   Neutrophils Relative 81 (*) 43 - 77 %   Neutro Abs 16.0 (*) 1.7 - 7.7 K/uL   Lymphocytes Relative 12  12 - 46 %   Lymphs Abs 2.3  0.7 - 4.0 K/uL   Monocytes Relative 7  3 - 12 %   Monocytes Absolute 1.3 (*) 0.1 - 1.0 K/uL   Eosinophils Relative 0  0 - 5  %   Eosinophils Absolute 0.0  0.0 - 0.7 K/uL   Basophils Relative 0  0 - 1 %   Basophils Absolute 0.1  0.0 - 0.1 K/uL  CBC WITH DIFFERENTIAL   Collection Time    12/13/12  6:05 AM      Result Value Range   WBC 18.5 (*) 4.0 - 10.5 K/uL   RBC 3.84 (*) 3.87 - 5.11 MIL/uL   Hemoglobin 10.6 (*) 12.0 - 15.0 g/dL   HCT 08.6 (*) 57.8 - 46.9 %   MCV 85.4  78.0 - 100.0 fL   MCH 27.6  26.0 - 34.0 pg   MCHC 32.3  30.0 - 36.0 g/dL   RDW 62.9  52.8 - 41.3 %   Platelets 244  150 - 400 K/uL   Neutrophils Relative 81 (*) 43 - 77 %   Neutro Abs 14.9 (*) 1.7 - 7.7 K/uL   Lymphocytes Relative 11 (*) 12 - 46 %   Lymphs Abs 1.9  0.7 - 4.0 K/uL   Monocytes Relative 8  3 - 12 %   Monocytes Absolute 1.5 (*) 0.1 - 1.0 K/uL   Eosinophils Relative 1  0 - 5 %   Eosinophils Absolute 0.1  0.0 - 0.7 K/uL   Basophils Relative  0  0 - 1 %   Basophils Absolute 0.1  0.0 - 0.1 K/uL    Imaging Studies:      Currently EPIC will not allow sonographic studies to automatically populate into notes.  In the meantime, copy and paste results into note or free text.  Medications:  Scheduled . amoxicillin  500 mg Oral Q8H  . erythromycin  250 mg Oral Q8H  . prenatal multivitamin  1 tablet Oral Q1200  . sodium chloride  3 mL Intravenous Q12H   I have reviewed the patient's current medications.  ASSESSMENT: Patient Active Problem List  Diagnosis  . PROM (premature rupture of membranes)  . Insufficient prenatal care   Labor ruled out, she received magnesium sulfate for CP prophylaxis PLAN: Observe for s/sx PTL, chorioamnionitis  ARNOLD,JAMES 12/13/2012,7:17 AM

## 2012-12-13 NOTE — Progress Notes (Signed)
UR completed 

## 2012-12-13 NOTE — Progress Notes (Signed)
Patient arrived on unit from [facility] and was requesting to take a shower.  Patient's IV was saline locked, wrapped, and patient went to restroom to shower.

## 2012-12-14 ENCOUNTER — Encounter (HOSPITAL_COMMUNITY): Payer: Self-pay | Admitting: Obstetrics and Gynecology

## 2012-12-14 DIAGNOSIS — O4290 Premature rupture of membranes, unspecified as to length of time between rupture and onset of labor, unspecified weeks of gestation: Secondary | ICD-10-CM

## 2012-12-14 DIAGNOSIS — O0930 Supervision of pregnancy with insufficient antenatal care, unspecified trimester: Secondary | ICD-10-CM

## 2012-12-14 MED ORDER — OXYCODONE-ACETAMINOPHEN 5-325 MG PO TABS: 1.0000 | ORAL_TABLET | ORAL | Status: DC | PRN

## 2012-12-14 MED ORDER — ONDANSETRON HCL 4 MG PO TABS: 4.0000 mg | ORAL_TABLET | ORAL | Status: DC | PRN

## 2012-12-14 MED ORDER — LANOLIN HYDROUS EX OINT: TOPICAL_OINTMENT | CUTANEOUS | Status: DC | PRN

## 2012-12-14 MED ORDER — LACTATED RINGERS IV SOLN: 500.0000 mL | INTRAVENOUS | Status: DC | PRN

## 2012-12-14 MED ORDER — LIDOCAINE HCL (PF) 1 % IJ SOLN
30.0000 mL | INTRAMUSCULAR | Status: DC | PRN
  Filled 2012-12-14 (×2): qty 30

## 2012-12-14 MED ORDER — TETANUS-DIPHTH-ACELL PERTUSSIS 5-2.5-18.5 LF-MCG/0.5 IM SUSP
0.5000 mL | Freq: Once | INTRAMUSCULAR | Status: AC
  Administered 2012-12-15: 0.5 mL via INTRAMUSCULAR
  Filled 2012-12-14: qty 0.5

## 2012-12-14 MED ORDER — DIPHENHYDRAMINE HCL 25 MG PO CAPS: 25.0000 mg | ORAL_CAPSULE | Freq: Four times a day (QID) | ORAL | Status: DC | PRN

## 2012-12-14 MED ORDER — OXYTOCIN BOLUS FROM INFUSION: 500.0000 mL | INTRAVENOUS | Status: DC

## 2012-12-14 MED ORDER — PRENATAL MULTIVITAMIN CH
1.0000 | ORAL_TABLET | Freq: Every day | ORAL | Status: DC
  Administered 2012-12-14: 1 via ORAL
  Filled 2012-12-14: qty 1

## 2012-12-14 MED ORDER — NALBUPHINE SYRINGE 5 MG/0.5 ML
10.0000 mg | INJECTION | INTRAMUSCULAR | Status: DC | PRN
  Filled 2012-12-14: qty 1

## 2012-12-14 MED ORDER — SIMETHICONE 80 MG PO CHEW: 80.0000 mg | CHEWABLE_TABLET | ORAL | Status: DC | PRN

## 2012-12-14 MED ORDER — BENZOCAINE-MENTHOL 20-0.5 % EX AERO: 1.0000 "application " | INHALATION_SPRAY | CUTANEOUS | Status: DC | PRN

## 2012-12-14 MED ORDER — OXYTOCIN 40 UNITS IN LACTATED RINGERS INFUSION - SIMPLE MED
62.5000 mL/h | INTRAVENOUS | Status: DC
  Filled 2012-12-14: qty 1000

## 2012-12-14 MED ORDER — IBUPROFEN 600 MG PO TABS: 600.0000 mg | ORAL_TABLET | Freq: Four times a day (QID) | ORAL | Status: DC | PRN

## 2012-12-14 MED ORDER — CITRIC ACID-SODIUM CITRATE 334-500 MG/5ML PO SOLN: 30.0000 mL | ORAL | Status: DC | PRN

## 2012-12-14 MED ORDER — TERBUTALINE SULFATE 1 MG/ML IJ SOLN: 0.2500 mg | Freq: Once | INTRAMUSCULAR | Status: DC | PRN

## 2012-12-14 MED ORDER — LACTATED RINGERS IV SOLN: INTRAVENOUS | Status: DC

## 2012-12-14 MED ORDER — ONDANSETRON HCL 4 MG/2ML IJ SOLN: 4.0000 mg | INTRAMUSCULAR | Status: DC | PRN

## 2012-12-14 MED ORDER — SENNOSIDES-DOCUSATE SODIUM 8.6-50 MG PO TABS
2.0000 | ORAL_TABLET | Freq: Every day | ORAL | Status: DC
  Administered 2012-12-14 – 2012-12-15 (×2): 2 via ORAL

## 2012-12-14 MED ORDER — ONDANSETRON HCL 4 MG/2ML IJ SOLN: 4.0000 mg | Freq: Four times a day (QID) | INTRAMUSCULAR | Status: DC | PRN

## 2012-12-14 MED ORDER — DIBUCAINE 1 % RE OINT: 1.0000 "application " | TOPICAL_OINTMENT | RECTAL | Status: DC | PRN

## 2012-12-14 MED ORDER — ACETAMINOPHEN 325 MG PO TABS: 650.0000 mg | ORAL_TABLET | ORAL | Status: DC | PRN

## 2012-12-14 MED ORDER — WITCH HAZEL-GLYCERIN EX PADS: 1.0000 "application " | MEDICATED_PAD | CUTANEOUS | Status: DC | PRN

## 2012-12-14 MED ORDER — IBUPROFEN 600 MG PO TABS
600.0000 mg | ORAL_TABLET | Freq: Four times a day (QID) | ORAL | Status: DC
  Administered 2012-12-14 – 2012-12-15 (×7): 600 mg via ORAL
  Filled 2012-12-14 (×8): qty 1

## 2012-12-14 MED ORDER — ZOLPIDEM TARTRATE 5 MG PO TABS: 5.0000 mg | ORAL_TABLET | Freq: Every evening | ORAL | Status: DC | PRN

## 2012-12-14 MED ORDER — OXYTOCIN 40 UNITS IN LACTATED RINGERS INFUSION - SIMPLE MED
1.0000 m[IU]/min | INTRAVENOUS | Status: DC
  Administered 2012-12-14: 2 m[IU]/min via INTRAVENOUS
  Administered 2012-12-14: 4 m[IU]/min via INTRAVENOUS

## 2012-12-14 NOTE — Progress Notes (Signed)
Patient ID: Kim Robertson, female   DOB: [birth year], 32 y.o.   MRN: [id number] Called to see pt for reg and stronger ctx requiring her to breathe through them. Earlier was given IVF and Fentanyl which caused ctx to subside for a time but now they are more regular. FHR 140s + accels Ctx not picking up on toco but per pt are q 3-4 mins SSE- cx visually 4+cm/100%/vtx  Will transfer to L&D Fentanyl/epidural prn Anticipate SVD  Dianna Ewald 12/14/2012 1:03 AM

## 2012-12-14 NOTE — Plan of Care (Signed)
Problem: Consults Goal: Birthing Suites Patient Information Press F2 to bring up selections list  Outcome: Completed/Met Date Met:  12/14/12  Pt < [redacted] weeks EGA

## 2012-12-14 NOTE — Progress Notes (Signed)
Patient ID: Cecillia Menees, female   DOB: Mar 03, 1981, 32 y.o.   MRN: [id number] Delivery Note At 5:13 AM a viable, healthy and preterm female was delivered via Vaginal, Spontaneous Delivery (Presentation:  OA, vertex;  ).  APGAR: , ; weight 2 lb 4.7 oz (1040 g).   Placenta status: , .  Cord:  with the following complications: .  Cord pH: n/a  Anesthesia: None  Episiotomy:  Lacerations:  Suture Repair:  Est. Blood Loss (mL): 100cc  Mom to postpartum.  Baby to NICU.  Azelie Noguera V 12/14/2012, 5:31 AM

## 2012-12-15 ENCOUNTER — Encounter (HOSPITAL_COMMUNITY): Payer: Self-pay | Admitting: *Deleted

## 2012-12-15 LAB — CBC
Platelets: 247 10*3/uL (ref 150–400)
RBC: 4.27 MIL/uL (ref 3.87–5.11)
RDW: 14 % (ref 11.5–15.5)
WBC: 16.6 10*3/uL — ABNORMAL HIGH (ref 4.0–10.5)

## 2012-12-15 NOTE — Progress Notes (Signed)
Post Partum Day 1  Subjective: no complaints, up ad lib, voiding, tolerating PO and + flatus  Objective: Blood pressure 95/41, pulse 60, temperature 98.1 F (36.7 C), temperature source Oral, resp. rate 19, height 5\' 3"  (1.6 m), weight 84.823 kg (187 lb), last menstrual period 06/26/2012, SpO2 99.00%, unknown if currently breastfeeding.  Physical Exam:  General: alert, cooperative and appears stated age Lochia: appropriate Uterine Fundus: firm DVT Evaluation: No evidence of DVT seen on physical exam.   Recent Labs  12/13/12 0605 12/15/12 0600  HGB 10.6* 11.9*  HCT 32.8* 36.2    Assessment/Plan: Plan for discharge tomorrow. Baby in NICU after delivery at 27.5wks Pumping breast milk Unsure of contraception   LOS: 6 days   Tallia Moehring, MD  Family Medicine Resident PGY-2 12/15/2012, 7:29 AM

## 2012-12-15 NOTE — Progress Notes (Signed)
Baby is 27 5/7 weeks and in the NICU. Mother was started with pumping her breast at 3 hours post delivery. She has been pumping every 3 hours. She has expressed a few drops in the flange. Discussed supply and demand and the benefits of frequent pumping to stimulate milk production. Reinforced cleaning pump equipment and pumping 8/24 hours. Showed mother how to hand express, no colostrum noted. Instructed to use hand expression following pumping to promote supply. NICU booklet and handout from lactation given and explained. Informed of inpatient and out -patient lactation services. Instructed nurse can assist with questions and pumping as needed. LC to follow. Mother has WIC and instructed to call to make arrangements for obtaining a DEBP post discharge due to prematurity and hospitalization.  

## 2012-12-15 NOTE — Clinical Social Work Note (Signed)
Clinical Social Work Department PSYCHOSOCIAL ASSESSMENT - MATERNAL/CHILD 12/14/2012  Patient:  Kim Robertson,Kim Robertson  Account Number:  [id number]  Admit Date:  12/09/2012  Childs Name:   Kim Robertson    Clinical Social Worker:  Emmely Bittinger, LCSW   Date/Time:  12/14/2012 11:30 AM  Date Referred:  12/15/2012   Referral source  Physician     Referred reason  NICU   Other referral source:    I:  FAMILY / HOME ENVIRONMENT Child's legal guardian:  PARENT  Guardian - Name Guardian - Age Guardian - Address  Kim Robertson 29 [address] [place], [place] [address]  Kim Robertson  [address] [place], [place] [address]   Other household support members/support persons Name Relationship DOB  none     Other support:   MOB and FOB report good family support.    II  PSYCHOSOCIAL DATA Information Source:  Patient Interview  Financial and Community Resources Employment:   MOB unemployed  FOB works for [employer] in [place]   Financial resources:  Medicaid If Medicaid - County:  [place] Other  WIC   School / Grade:   Maternity Care Coordinator / Child Services Coordination / Early Interventions:  Cultural issues impacting care:    III  STRENGTHS Strengths  Understanding of illness  Compliance with medical plan  Home prepared for Child (including basic supplies)  Adequate Resources  Supportive family/friends   Strength comment:    IV  RISK FACTORS AND CURRENT PROBLEMS Current Problem:  None   Risk Factor & Current Problem Patient Issue Family Issue Risk Factor / Current Problem Comment   N N     V  SOCIAL WORK ASSESSMENT CSW spoke with MOB and FOB in room.  CSW discussed admission to NICU and understanding of illness.  MOB and FOB report good communication with MD's and nurses and understand current treatment.  CSW discussed any emotional concerns.  MOB and FOB reported appropriate emotion response to infant being in NICU.  CSW discussed PPD with MOB and  symptoms to look out for.  No hx of MH concerns for MOB per chart.  No hx of SA concerns per chart. CSW discussed support from SW while infant is in NICU.  CSW discussed supplies and family support.  MOB and FOB report good family support that live in area.  MOB and FOB expressed no concerns with supplies, however will let CSW know if any supply concerns arise for infant. CSW discussed infants qualifications for SSI and completed worksheet to start application.  CSW will continue to follow to offer support while infant is in NICU.      VI SOCIAL WORK PLAN Social Work Plan  Psychosocial Support/Ongoing Assessment of Needs   Type of pt/family education:   PPD symptoms  SSI qualifications   If child protective services report - county:   If child protective services report - date:   Information/referral to community resources comment:   Other social work plan:    

## 2012-12-16 ENCOUNTER — Encounter: Payer: Medicaid Other | Admitting: Family Medicine

## 2012-12-16 MED ORDER — OXYCODONE-ACETAMINOPHEN 5-325 MG PO TABS: 1.0000 | ORAL_TABLET | ORAL | Status: DC | PRN

## 2012-12-16 MED ORDER — IBUPROFEN 600 MG PO TABS: 600.0000 mg | ORAL_TABLET | Freq: Four times a day (QID) | ORAL | Status: DC

## 2012-12-16 NOTE — Progress Notes (Signed)
Discharge instructions reviewed with patient.  Patient states understanding of home care, activity, medications, signs/symptoms to report to MD and return MD visit.  No home equipment needed.  Patient ambulated in stable condition with staff without incident for discharge.

## 2012-12-16 NOTE — Discharge Summary (Signed)
Obstetric Discharge Summary Reason for Admission: rupture of membranes Prenatal Procedures: NST Intrapartum Procedures: spontaneous vaginal delivery Postpartum Procedures: none Complications-Operative and Postpartum: none Hemoglobin  Date Value Range Status  12/15/2012 11.9* 12.0 - 15.0 g/dL Final     HCT  Date Value Range Status  12/15/2012 36.2  36.0 - 46.0 % Final  Hospital Course: Kim Robertson is a 32 y.o. G1P0 at [redacted]w[redacted]d who is admitted for rupture of membranes.  Estimated Date of Delivery: 03/10/13 by 27 week ultrasound (not consistent with reported LMP)  Fetal presentation is cephalic by ultrasound 12/09/12. Labor ruled out, she received magnesium sulfate for CP prophylaxis On 12/14/12, Delivery Note  At 5:13 AM a viable, healthy and preterm female was delivered via Vaginal, Spontaneous Delivery (Presentation: OA, vertex; ). APGAR: , ; weight 2 lb 4.7 oz (1040 g).  Placenta status: , . Cord: with the following complications: . Cord pH: n/a  Anesthesia: None  Episiotomy:  Lacerations:  Suture Repair:  Est. Blood Loss (mL): 100cc  Mom to postpartum. Baby to NICU  She has done well postpartum and baby is improving in NICU.  Physical Exam:  General: alert, cooperative and no distress Lochia: appropriate Uterine Fundus: firm Incision: healing well DVT Evaluation: No evidence of DVT seen on physical exam.  Discharge Diagnoses: Premature labor and Preterm delivery after PPROM  Discharge Information: Date: 12/16/2012 Activity: unrestricted and pelvic rest Diet: routine Medications: Ibuprofen and Percocet for back pain (limited Rx) Condition: stable Instructions: refer to practice specific booklet Discharge to: home  Undecided about contraception right now. Wants to talk to her husband. Will schedule a 6 week appt in our clinic  Newborn Data: Live born female  Birth Weight: 2 lb 4.7 oz (1040 g) APGAR: 8, 9  Home with NICU.  [facility] 12/16/2012, 6:39 AM

## 2012-12-16 NOTE — Progress Notes (Signed)
I saw and examined patient and agree with above resident note. I reviewed history, delivery summary, labs and vitals. Nelson Noone, MD  

## 2012-12-17 NOTE — Discharge Summary (Signed)
Attestation of Attending Supervision of Advanced Practitioner: Evaluation and management procedures were performed by the PA/NP/CNM/OB Fellow under my supervision/collaboration. Chart reviewed and agree with management and plan.  Winford Hehn V 12/17/2012 11:58 AM

## 2013-01-09 ENCOUNTER — Encounter (HOSPITAL_COMMUNITY): Payer: Self-pay | Admitting: *Deleted

## 2013-01-09 ENCOUNTER — Inpatient Hospital Stay (HOSPITAL_COMMUNITY)
Admission: AD | Admit: 2013-01-09 | Discharge: 2013-01-09 | Disposition: A | Discharge status: Home / Self Care | Payer: Medicaid Other | Source: Ambulatory Visit | Attending: Obstetrics & Gynecology | Admitting: Obstetrics & Gynecology

## 2013-01-09 DIAGNOSIS — K602 Anal fissure, unspecified: Secondary | ICD-10-CM | POA: Insufficient documentation

## 2013-01-09 DIAGNOSIS — O99893 Other specified diseases and conditions complicating puerperium: Secondary | ICD-10-CM | POA: Insufficient documentation

## 2013-01-09 DIAGNOSIS — K6289 Other specified diseases of anus and rectum: Secondary | ICD-10-CM | POA: Insufficient documentation

## 2013-01-09 MED ORDER — POLYETHYLENE GLYCOL 3350 17 G PO PACK: 17.0000 g | PACK | Freq: Every day | ORAL | Status: DC

## 2013-01-09 MED ORDER — LIDOCAINE (ANORECTAL) 5 % EX CREA: 1.0000 mL | TOPICAL_CREAM | Freq: Four times a day (QID) | CUTANEOUS | Status: DC | PRN

## 2013-01-09 NOTE — MAU Provider Note (Signed)
History     CSN: [id number]  Arrival date and time: 01/09/13 1426   None     Chief Complaint  Patient presents with  . Rectal Pain   HPI Patient states that she has a 10/10 pain at her rectum.  She characterizes the pain as constant throbbing pain with occasional sharpness.  The pain has been present for 3 days; 10/10 initially that waned and has returned to 10/10 this morning. She has taken previously prescribed ibuprofen which has only minimally helped her pain.  She has had a vaginal delivery 3 weeks ago.  She states that her last bowel movement was this morning and that this started the pain.  She states that her bowel movements have been daily since the pain has started and that they may be larger than normal, but not any harder.  She states that she noticed blood on the tissue after she whiped 3 days ago, but none since.  She denies hemorrhoids or any tears in her rectum.  She has maintained a regular diet and makes urine regularly without pain.  OB History   Grav Para Term Preterm Abortions TAB SAB Ect Mult Living   1 1  1      1       Past Medical History  Diagnosis Date  . Medical history non-contributory     Past Surgical History  Procedure Laterality Date  . No past surgeries      History reviewed. No pertinent family history.  History  Substance Use Topics  . Smoking status: Never Smoker   . Smokeless tobacco: Never Used  . Alcohol Use: No    Allergies:  Allergies  Allergen Reactions  . Neosporin (Neomycin-Bacitracin Zn-Polymyx) Rash    Prescriptions prior to admission  Medication Sig Dispense Refill  . ibuprofen (ADVIL,MOTRIN) 600 MG tablet Take 600 mg by mouth every 6 (six) hours.      . Prenatal Vit-Fe Fumarate-FA (MULTIVITAMIN-PRENATAL) 27-0.8 MG TABS Take 1 tablet by mouth daily at 12 noon.      . [DISCONTINUED] ibuprofen (ADVIL,MOTRIN) 600 MG tablet Take 1 tablet (600 mg total) by mouth every 6 (six) hours.  30 tablet  1  . oxyCODONE-acetaminophen  (PERCOCET/ROXICET) 5-325 MG per tablet Take 1-2 tablets by mouth every 4 (four) hours as needed for pain.      . [DISCONTINUED] oxyCODONE-acetaminophen (PERCOCET/ROXICET) 5-325 MG per tablet Take 1-2 tablets by mouth every 4 (four) hours as needed.  20 tablet  0    Review of Systems  Constitutional: Negative for fever and chills.  Respiratory: Negative for cough.   Cardiovascular: Negative for chest pain.  Gastrointestinal: Negative for nausea, vomiting, abdominal pain, diarrhea, blood in stool and melena.  Genitourinary: Negative for dysuria, urgency, hematuria and flank pain.  Neurological: Negative for headaches.   Physical Exam   Blood pressure 116/65, pulse 65, temperature 99 F (37.2 C), temperature source Oral, resp. rate 20, currently breastfeeding.  Physical Exam  Constitutional: She is oriented to person, place, and time. She appears well-developed and well-nourished. No distress.  HENT:  Head: Normocephalic and atraumatic.  Eyes: Conjunctivae and EOM are normal.  Neck: Normal range of motion. Neck supple.  Cardiovascular: Normal rate and regular rhythm.  Exam reveals no gallop and no friction rub.   No murmur heard. Respiratory: Effort normal. She has no wheezes. She has no rales.  GI: Soft. She exhibits distension. There is no tenderness. There is no rebound and no guarding.  Genitourinary:  Rectum:  Manson Passey  stool residue around anus. Small skin tag at 12:00. Exquisitely tender at 6:00, possible small fissure/tear.  Neurological: She is alert and oriented to person, place, and time.  Skin: Skin is warm and dry.    MAU Course  Procedures   Assessment and Plan  32 y.o. G1P0101 s/p SVD on 12/14/12.  - Anal fissure:  Lidocaine cream, MiraLax, nifedipine cream (0.2%) - Keep stools soft, sitz baths  Anna Genre 01/09/2013, 3:50 PM   I saw and examined patient and agree with above student note. I reviewed history, vitals, delivery summary.  Napoleon Form, MD

## 2013-01-09 NOTE — MAU Note (Signed)
Vag delivery 04/12.  Rectal pain started 4 days ago, first day was worse. Hard to sit or walk.  Has been constipated, last BM was this morning.  Pain starts after bm.

## 2013-01-13 NOTE — MAU Provider Note (Signed)
Attestation of Attending Supervision of Advanced Practitioner (CNM/NP): Evaluation and management procedures were performed by the Advanced Practitioner under my supervision and collaboration.  I have reviewed the Advanced Practitioner's note and chart, and I agree with the management and plan.  Kim Robertson, Kim Robertson 10:51 AM     

## 2013-01-29 ENCOUNTER — Encounter: Payer: Self-pay | Admitting: Family Medicine

## 2013-01-29 ENCOUNTER — Ambulatory Visit (INDEPENDENT_AMBULATORY_CARE_PROVIDER_SITE_OTHER): Payer: Medicaid Other | Admitting: Family Medicine

## 2013-01-29 ENCOUNTER — Other Ambulatory Visit (HOSPITAL_COMMUNITY)
Admission: RE | Admit: 2013-01-29 | Discharge: 2013-01-29 | Disposition: A | Discharge status: Home / Self Care | Payer: Medicaid Other | Source: Ambulatory Visit | Attending: Family Medicine | Admitting: Family Medicine

## 2013-01-29 DIAGNOSIS — Z01419 Encounter for gynecological examination (general) (routine) without abnormal findings: Secondary | ICD-10-CM | POA: Insufficient documentation

## 2013-01-29 DIAGNOSIS — N898 Other specified noninflammatory disorders of vagina: Secondary | ICD-10-CM

## 2013-01-29 DIAGNOSIS — N949 Unspecified condition associated with female genital organs and menstrual cycle: Secondary | ICD-10-CM

## 2013-01-29 NOTE — Progress Notes (Signed)
Patient ID: Kim Robertson, female   DOB: July 15, 1981, 32 y.o.   MRN: [id number]  Subjective:     Kim Robertson is a 32 y.o. female who presents for a postpartum visit. She is 6 weeks postpartum following a spontaneous vaginal delivery. I have fully reviewed the prenatal and intrapartum course. The delivery was at [redacted]w[redacted]d gestation. Outcome: spontaneous vaginal delivery. Anesthesia: none. Postpartum course has been unremarkable.Baby is still in NICU doing well per mother, having some issues with edema but breathing and feeding well. Mother is pumping breast milk every 3 hours. Bleeding no bleeding. Bowel function is normal. Bladder function is normal. Patient is sexually active. Contraception method is condoms. Postpartum depression screening: negative.  The following portions of the patient's history were reviewed and updated as appropriate: allergies, current medications, past family history, past medical history, past social history, past surgical history and problem list.  Review of Systems Pertinent items are noted in HPI.   Objective:    BP 133/73  Pulse 70  Temp(Src) 97 F (36.1 C) (Oral)  Resp 20  Ht 5\' 3"  (1.6 m)  Wt 179 lb 6.4 oz (81.375 kg)  BMI 31.79 kg/m2  Breastfeeding? Yes   GEN:  WNWD, no distress HEENT:  NCAT, EOMI, conjunctiva clear NECK:  Supple, non-tender, no thyromegaly, trachea midline ABD:  Soft, non-tender, no guarding or rebound EXTREM:  Warm, well perfused, no edema or tenderness NEURO:  Alert, oriented, no focal deficits GU:  Normal external genitalia, normal vagina, some odor, Cervix with mild ectropion, spotting after pap. No CMT, no adnexal tenderness or masses. Pap smear done and wet prep.   Assessment:     Normal postpartum exam. Pap smear done at today's visit.   Plan:    1. Contraception: condoms.  Discussed possibility of return of ovulation prior to menses even when breastfeeding. Continue prenatal vitamins. Discussed ideal interval between  pregnancies/deliveries of at least 18 months. 2.  Follow up in:1 year or as needed.

## 2013-01-29 NOTE — Progress Notes (Signed)
Patient ID: Kim Robertson, female   DOB: Dec 22, 1980, 32 y.o.   MRN: [id number] Pt had NSVD of preterm infant on 12/17/12. He remains in NICU but is progressing well per pt.

## 2013-01-29 NOTE — Patient Instructions (Addendum)
Pap Test A Pap test is a procedure done in a clinic office to evaluate cells that are on the surface of the cervix. The cervix is the lower portion of the uterus and upper portion of the vagina. For some women, the cervical region has the potential to form cancer. With consistent evaluations by your caregiver, this type of cancer can be prevented.  If a Pap test is abnormal, it is most often a result of a previous exposure to human papillomavirus (HPV). HPV is a virus that can infect the cells of the cervix and cause dysplasia. Dysplasia is where the cells no longer look normal. If a woman has been diagnosed with high-grade or severe dysplasia, they are at higher risk of developing cervical cancer. People diagnosed with low-grade dysplasia should still be seen by their caregiver because there is a small chance that low-grade dysplasia could develop into cancer.  LET YOUR CAREGIVER KNOW ABOUT:  Recent sexually transmitted infection (STI) you have had.  Any new sex partners you have had.  History of previous abnormal Pap tests results.  History of previous cervical procedures you have had (colposcopy, biopsy, loop electrosurgical excision procedure [LEEP]).  Concerns you have had regarding unusual vaginal discharge.  History of pelvic pain.  Your use of birth control. BEFORE THE PROCEDURE  Ask your caregiver when to schedule your Pap test. It is best not to be on your period if your caregiver uses a wooden spatula to collect cells or applies cells to a glass slide. Newer techniques are not so sensitive to the timing of a menstrual cycle.  Do not douche or have sexual intercourse for 24 hours before the test.   Do not use vaginal creams or tampons for 24 hours before the test.   Empty your bladder just before the test to lessen any discomfort.  PROCEDURE You will lie on an exam table with your feet in stirrups. A warm metal or plastic instrument (speculum) is placed in your vagina. This  instrument allows your caregiver to see the inside of your vagina and look at your cervix. A small, plastic brush or wooden spatula is then used to collect cervical cells. These cells are placed in a lab specimen container. The cells are looked at under a microscope. A specialist will determine if the cells are normal.  AFTER THE PROCEDURE Make sure to get your test results.If your results come back abnormal, you may need further testing.  Document Released: 11/11/2002 Document Revised: 11/13/2011 Document Reviewed: 08/17/2011 [facility] Patient Information 2014 [place], [place].  Contraception Choices Contraception (birth control) is the use of any methods or devices to prevent pregnancy. Below are some methods to help avoid pregnancy. HORMONAL METHODS   Contraceptive implant. This is a thin, plastic tube containing progesterone hormone. It does not contain estrogen hormone. Your caregiver inserts the tube in the inner part of the upper arm. The tube can remain in place for up to 3 years. After 3 years, the implant must be removed. The implant prevents the ovaries from releasing an egg (ovulation), thickens the cervical mucus which prevents sperm from entering the uterus, and thins the lining of the inside of the uterus.  Progesterone-only injections. These injections are given every 3 months by your caregiver to prevent pregnancy. This synthetic progesterone hormone stops the ovaries from releasing eggs. It also thickens cervical mucus and changes the uterine lining. This makes it harder for sperm to survive in the uterus.  Birth control pills. These pills contain estrogen  and progesterone hormone. They work by stopping the egg from forming in the ovary (ovulation). Birth control pills are prescribed by a caregiver.Birth control pills can also be used to treat heavy periods.  Minipill. This type of birth control pill contains only the progesterone hormone. They are taken every day of each month and  must be prescribed by your caregiver.  Birth control patch. The patch contains hormones similar to those in birth control pills. It must be changed once a week and is prescribed by a caregiver.  Vaginal ring. The ring contains hormones similar to those in birth control pills. It is left in the vagina for 3 weeks, removed for 1 week, and then a new one is put back in place. The patient must be comfortable inserting and removing the ring from the vagina.A caregiver's prescription is necessary.  Emergency contraception. Emergency contraceptives prevent pregnancy after unprotected sexual intercourse. This pill can be taken right after sex or up to 5 days after unprotected sex. It is most effective the sooner you take the pills after having sexual intercourse. Emergency contraceptive pills are available without a prescription. Check with your pharmacist. Do not use emergency contraception as your only form of birth control. BARRIER METHODS   Female condom. This is a thin sheath (latex or rubber) that is worn over the penis during sexual intercourse. It can be used with spermicide to increase effectiveness.  Female condom. This is a soft, loose-fitting sheath that is put into the vagina before sexual intercourse.  Diaphragm. This is a soft, latex, dome-shaped barrier that must be fitted by a caregiver. It is inserted into the vagina, along with a spermicidal jelly. It is inserted before intercourse. The diaphragm should be left in the vagina for 6 to 8 hours after intercourse.  Cervical cap. This is a round, soft, latex or plastic cup that fits over the cervix and must be fitted by a caregiver. The cap can be left in place for up to 48 hours after intercourse.  Sponge. This is a soft, circular piece of polyurethane foam. The sponge has spermicide in it. It is inserted into the vagina after wetting it and before sexual intercourse.  Spermicides. These are chemicals that kill or block sperm from entering  the cervix and uterus. They come in the form of creams, jellies, suppositories, foam, or tablets. They do not require a prescription. They are inserted into the vagina with an applicator before having sexual intercourse. The process must be repeated every time you have sexual intercourse. INTRAUTERINE CONTRACEPTION  Intrauterine device (IUD). This is a T-shaped device that is put in a woman's uterus during a menstrual period to prevent pregnancy. There are 2 types:  Copper IUD. This type of IUD is wrapped in copper wire and is placed inside the uterus. Copper makes the uterus and fallopian tubes produce a fluid that kills sperm. It can stay in place for 10 years.  Hormone IUD. This type of IUD contains the hormone progestin (synthetic progesterone). The hormone thickens the cervical mucus and prevents sperm from entering the uterus, and it also thins the uterine lining to prevent implantation of a fertilized egg. The hormone can weaken or kill the sperm that get into the uterus. It can stay in place for 5 years. PERMANENT METHODS OF CONTRACEPTION  Female tubal ligation. This is when the woman's fallopian tubes are surgically sealed, tied, or blocked to prevent the egg from traveling to the uterus.  Female sterilization. This is when the female  has the tubes that carry sperm tied off (vasectomy).This blocks sperm from entering the vagina during sexual intercourse. After the procedure, the man can still ejaculate fluid (semen). NATURAL PLANNING METHODS  Natural family planning. This is not having sexual intercourse or using a barrier method (condom, diaphragm, cervical cap) on days the woman could become pregnant.  Calendar method. This is keeping track of the length of each menstrual cycle and identifying when you are fertile.  Ovulation method. This is avoiding sexual intercourse during ovulation.  Symptothermal method. This is avoiding sexual intercourse during ovulation, using a thermometer and  ovulation symptoms.  Post-ovulation method. This is timing sexual intercourse after you have ovulated. Regardless of which type or method of contraception you choose, it is important that you use condoms to protect against the transmission of sexually transmitted diseases (STDs). Talk with your caregiver about which form of contraception is most appropriate for you. Document Released: 08/21/2005 Document Revised: 11/13/2011 Document Reviewed: 12/28/2010 [facility] Patient Information 2014 [place], [place].

## 2013-01-30 LAB — WET PREP, GENITAL
Clue Cells Wet Prep HPF POC: NONE SEEN
Trich, Wet Prep: NONE SEEN
Yeast Wet Prep HPF POC: NONE SEEN

## 2013-07-10 ENCOUNTER — Other Ambulatory Visit: Payer: Self-pay

## 2014-02-28 ENCOUNTER — Encounter (HOSPITAL_COMMUNITY): Payer: Self-pay | Admitting: *Deleted

## 2014-02-28 ENCOUNTER — Inpatient Hospital Stay (HOSPITAL_COMMUNITY)
Admission: AD | Admit: 2014-02-28 | Discharge: 2014-02-28 | Disposition: A | Discharge status: Home / Self Care | Payer: BC Managed Care – PPO | Source: Ambulatory Visit | Attending: Obstetrics & Gynecology | Admitting: Obstetrics & Gynecology

## 2014-02-28 DIAGNOSIS — Z3202 Encounter for pregnancy test, result negative: Secondary | ICD-10-CM | POA: Insufficient documentation

## 2014-02-28 DIAGNOSIS — R112 Nausea with vomiting, unspecified: Secondary | ICD-10-CM

## 2014-02-28 DIAGNOSIS — R109 Unspecified abdominal pain: Secondary | ICD-10-CM | POA: Insufficient documentation

## 2014-02-28 DIAGNOSIS — R1032 Left lower quadrant pain: Secondary | ICD-10-CM

## 2014-02-28 LAB — URINALYSIS, ROUTINE W REFLEX MICROSCOPIC
BILIRUBIN URINE: NEGATIVE
Glucose, UA: NEGATIVE mg/dL
Ketones, ur: NEGATIVE mg/dL
Leukocytes, UA: NEGATIVE
NITRITE: NEGATIVE
PH: 6 (ref 5.0–8.0)
Protein, ur: NEGATIVE mg/dL
SPECIFIC GRAVITY, URINE: 1.025 (ref 1.005–1.030)
Urobilinogen, UA: 0.2 mg/dL (ref 0.0–1.0)

## 2014-02-28 LAB — URINE MICROSCOPIC-ADD ON

## 2014-02-28 LAB — POCT PREGNANCY, URINE: Preg Test, Ur: NEGATIVE

## 2014-02-28 NOTE — MAU Note (Signed)
Pt presents to MAU stating that she needs a pregnancy. Reports home pregnancy test was negative and if her test is negative here she wants to be evaluated.

## 2014-02-28 NOTE — MAU Note (Signed)
Pt states had negative upt at home. Feels like she is pregnant. Has had nausea for past week and a half. LMP-02/19/2014. Denies vag d/c.

## 2014-02-28 NOTE — Discharge Instructions (Signed)
No fried foods. Drink at least 8 8-oz glasses of water every day. See a doctor if your symptoms worsen.

## 2014-02-28 NOTE — MAU Provider Note (Signed)

## 2014-02-28 NOTE — MAU Provider Note (Signed)
History     CSN: [id number]  Arrival date and time: 02/28/14 1433   First Malahki Gasaway Initiated Contact with Patient 02/28/14 1601      Chief Complaint  Patient presents with  . Possible Pregnancy  . Nausea  . Abdominal Pain   HPI Kim Robertson 32 y.o. Comes to MAU with vomiting for the past week and thinks she is pregnant.  Had diarrhea a couple of days this week but no diarrhea yesterday or today.  No vomiting today.  Has only had tea today.  Is feeling tired.  Did not get prenatal care until late in pregnancy last time and baby was born early.  Is worried that if she is pregnant, she wants to begin prenatal care early.  Is currently breastfeeding some and baby is 8916 months old.  Does not use birth control.  Would be fine with her to be pregnant.  Urine pregnancy test is negative.  Client wants to know why she is having lower abdominal pain and nausea.  OB History   Grav Para Term Preterm Abortions TAB SAB Ect Mult Living   1 1  1      1       Past Medical History  Diagnosis Date  . Medical history non-contributory     Past Surgical History  Procedure Laterality Date  . No past surgeries      History reviewed. No pertinent family history.  History  Substance Use Topics  . Smoking status: Never Smoker   . Smokeless tobacco: Never Used  . Alcohol Use: No    Allergies:  Allergies  Allergen Reactions  . Neosporin [Neomycin-Bacitracin Zn-Polymyx] Rash    Prescriptions prior to admission  Medication Sig Dispense Refill  . ibuprofen (ADVIL,MOTRIN) 600 MG tablet Take 600 mg by mouth every 6 (six) hours.      . Lidocaine, Anorectal, 5 % CREA Apply 1 mL topically 4 (four) times daily as needed.  15 g  0  . oxyCODONE-acetaminophen (PERCOCET/ROXICET) 5-325 MG per tablet Take 1-2 tablets by mouth every 4 (four) hours as needed for pain.      . polyethylene glycol (MIRALAX) packet Take 17 g by mouth daily.  30 each  1  . Prenatal Vit-Fe Fumarate-FA (MULTIVITAMIN-PRENATAL)  27-0.8 MG TABS Take 1 tablet by mouth daily at 12 noon.        Review of Systems  Constitutional: Positive for malaise/fatigue. Negative for fever.  Gastrointestinal: Positive for nausea, vomiting, abdominal pain and diarrhea. Negative for constipation.  Genitourinary:       No vaginal discharge. No vaginal bleeding. No dysuria.   Physical Exam   Blood pressure 116/64, pulse 63, temperature 98.3 F (36.8 C), temperature source Oral, resp. rate 18, height 5\' 3"  (1.6 m), weight 180 lb 4 oz (81.761 kg), last menstrual period 02/19/2014, currently breastfeeding.  Physical Exam  Nursing note and vitals reviewed. Constitutional: She is oriented to person, place, and time. She appears well-developed and well-nourished.  HENT:  Head: Normocephalic.  Eyes: EOM are normal.  Neck: Neck supple.  GI: Soft. There is tenderness. There is no rebound and no guarding.  Mild tenderness in LLQ.    Musculoskeletal: Normal range of motion.  Neurological: She is alert and oriented to person, place, and time.  Skin: Skin is warm and dry.  Psychiatric: She has a normal mood and affect.    MAU Course  Procedures  MDM Discussed symptoms at length.  LMP was 02-17-17.  Due to having diarrhea, may have  been an intestinal upset causing the nausea and vomiting.  Since LMP was this month, a negative pregnancy test would be expected.  Offered a blood pregnancy test, but after thinking it over, client decided not to have any more tests today and to return if her symptoms worsen.  Assessment and Plan  Nausea and vomiting Abdominal pain  Plan No fried foods. Drink at least 8 8-oz glasses of water every day. See a doctor if your symptoms worsen.  BURLESON,TERRI 02/28/2014, 4:20 PM

## 2014-05-13 ENCOUNTER — Inpatient Hospital Stay (HOSPITAL_COMMUNITY)
Admission: AD | Admit: 2014-05-13 | Discharge: 2014-05-13 | Disposition: A | Discharge status: Home / Self Care | Payer: BC Managed Care – PPO | Source: Ambulatory Visit | Attending: Obstetrics & Gynecology | Admitting: Obstetrics & Gynecology

## 2014-05-13 DIAGNOSIS — B3731 Acute candidiasis of vulva and vagina: Secondary | ICD-10-CM | POA: Diagnosis not present

## 2014-05-13 DIAGNOSIS — B373 Candidiasis of vulva and vagina: Secondary | ICD-10-CM | POA: Insufficient documentation

## 2014-05-13 DIAGNOSIS — L293 Anogenital pruritus, unspecified: Secondary | ICD-10-CM | POA: Diagnosis present

## 2014-05-13 LAB — URINALYSIS, ROUTINE W REFLEX MICROSCOPIC
Bilirubin Urine: NEGATIVE
GLUCOSE, UA: NEGATIVE mg/dL
KETONES UR: NEGATIVE mg/dL
LEUKOCYTES UA: NEGATIVE
Nitrite: NEGATIVE
PROTEIN: NEGATIVE mg/dL
Specific Gravity, Urine: <1.005 — ABNORMAL LOW (ref 1.005–1.030)
Urobilinogen, UA: 0.2 mg/dL (ref 0.0–1.0)
pH: 6.5 (ref 5.0–8.0)

## 2014-05-13 LAB — WET PREP, GENITAL
CLUE CELLS WET PREP: NONE SEEN
TRICH WET PREP: NONE SEEN

## 2014-05-13 LAB — POCT PREGNANCY, URINE: Preg Test, Ur: NEGATIVE

## 2014-05-13 LAB — URINE MICROSCOPIC-ADD ON

## 2014-05-13 MED ORDER — FLUCONAZOLE 150 MG PO TABS
150.0000 mg | ORAL_TABLET | Freq: Every day | ORAL | Status: DC
Start: 2014-05-13 — End: 2015-05-25

## 2014-05-13 NOTE — Discharge Instructions (Signed)

## 2014-05-13 NOTE — MAU Note (Signed)
Pt states she has been having vaginal itching for three days now, today the itching was severe. Pt states she has a small amount of white thick discharge. Denies bleeding pt denies pain when urinating, but says her vagina has a constant burning sensation.

## 2014-05-13 NOTE — MAU Provider Note (Signed)
History     CSN: [id number]  Arrival date and time: 05/13/14 1738   First Provider Initiated Contact with Patient 05/13/14 1841      Chief Complaint  Patient presents with  . Vaginal Itching   HPI Comments: Kim Robertson 33 y.o. G1P0101 presents to MAU with vaginal itching x 3 days. This itching is getting worse. She is using condoms for birth control and would like something better. Her husband is her only partner. She had used vagisil yesterday.  Vaginal Itching      Past Medical History  Diagnosis Date  . Medical history non-contributory     Past Surgical History  Procedure Laterality Date  . No past surgeries      No family history on file.  History  Substance Use Topics  . Smoking status: Never Smoker   . Smokeless tobacco: Never Used  . Alcohol Use: No    Allergies:  Allergies  Allergen Reactions  . Neosporin [Neomycin-Bacitracin Zn-Polymyx] Rash    Prescriptions prior to admission  Medication Sig Dispense Refill  . ibuprofen (ADVIL,MOTRIN) 600 MG tablet Take 600 mg by mouth every 6 (six) hours.      . Lidocaine, Anorectal, 5 % CREA Apply 1 mL topically 4 (four) times daily as needed.  15 g  0  . polyethylene glycol (MIRALAX) packet Take 17 g by mouth daily.  30 each  1  . Prenatal Vit-Fe Fumarate-FA (MULTIVITAMIN-PRENATAL) 27-0.8 MG TABS Take 1 tablet by mouth daily at 12 noon.        Review of Systems  Constitutional: Negative.   HENT: Negative.   Eyes: Negative.   Respiratory: Negative.   Cardiovascular: Negative.   Gastrointestinal: Negative.   Genitourinary:       Vaginal itching  Musculoskeletal: Negative.   Skin: Negative.   Neurological: Negative.   Psychiatric/Behavioral: Negative.    Physical Exam   Blood pressure 108/72, pulse 68, resp. rate 15, SpO2 100.00%, currently breastfeeding.  Physical Exam  Constitutional: She is oriented to person, place, and time. She appears well-developed and well-nourished. No distress.  HENT:   Head: Normocephalic and atraumatic.  Right Ear: External ear normal.  Eyes: Conjunctivae are normal. Pupils are equal, round, and reactive to light.  GI: Soft. She exhibits no distension. There is no tenderness. There is no rebound and no guarding.  Genitourinary:  Genital:External negative Vaginal:small amount white discharge Cervix:closed/ thick Bimanual:nontender   Musculoskeletal: Normal range of motion. She exhibits no edema and no tenderness.  Neurological: She is alert and oriented to person, place, and time.  Skin: Skin is warm and dry.  Psychiatric: She has a normal mood and affect. Her behavior is normal. Judgment and thought content normal.   Results for orders placed during the hospital encounter of 05/13/14 (from the past 24 hour(s))  URINALYSIS, ROUTINE W REFLEX MICROSCOPIC     Status: Abnormal   Collection Time    05/13/14  6:07 PM      Result Value Ref Range   Color, Urine YELLOW  YELLOW   APPearance CLEAR  CLEAR   Specific Gravity, Urine <1.005 (*) 1.005 - 1.030   pH 6.5  5.0 - 8.0   Glucose, UA NEGATIVE  NEGATIVE mg/dL   Hgb urine dipstick SMALL (*) NEGATIVE   Bilirubin Urine NEGATIVE  NEGATIVE   Ketones, ur NEGATIVE  NEGATIVE mg/dL   Protein, ur NEGATIVE  NEGATIVE mg/dL   Urobilinogen, UA 0.2  0.0 - 1.0 mg/dL   Nitrite NEGATIVE  NEGATIVE  Leukocytes, UA NEGATIVE  NEGATIVE  URINE MICROSCOPIC-ADD ON     Status: None   Collection Time    05/13/14  6:07 PM      Result Value Ref Range   Squamous Epithelial / LPF RARE  RARE   WBC, UA 0-2  <3 WBC/hpf   RBC / HPF 0-2  <3 RBC/hpf   Bacteria, UA RARE  RARE  POCT PREGNANCY, URINE     Status: None   Collection Time    05/13/14  6:26 PM      Result Value Ref Range   Preg Test, Ur NEGATIVE  NEGATIVE  WET PREP, GENITAL     Status: Abnormal   Collection Time    05/13/14  7:00 PM      Result Value Ref Range   Yeast Wet Prep HPF POC FEW (*) NONE SEEN   Trich, Wet Prep NONE SEEN  NONE SEEN   Clue Cells Wet Prep  HPF POC NONE SEEN  NONE SEEN   WBC, Wet Prep HPF POC FEW (*) NONE SEEN    MAU Course  Procedures  MDM Wet prep/ gc/chlamydia  Assessment and Plan  A: Vaginal Yeast Infection  P: Diflucan 150 mg / refill x 1 Avoid Vagisil Referred to OB office at [facility]  Carolynn Serve 05/13/2014, 7:08 PM

## 2014-05-14 LAB — GC/CHLAMYDIA PROBE AMP
CT Probe RNA: NEGATIVE
GC PROBE AMP APTIMA: NEGATIVE

## 2014-05-18 NOTE — MAU Provider Note (Signed)
Attestation of Attending Supervision of Advanced Practitioner: Evaluation and management procedures were performed by the PA/NP/CNM/OB Fellow under my supervision/collaboration. Chart reviewed and agree with management and plan.  Jakwon Gayton V 05/18/2014 4:54 PM

## 2014-05-19 ENCOUNTER — Encounter (HOSPITAL_COMMUNITY): Payer: Self-pay | Admitting: *Deleted

## 2014-05-19 ENCOUNTER — Inpatient Hospital Stay (HOSPITAL_COMMUNITY)
Admission: AD | Admit: 2014-05-19 | Discharge: 2014-05-19 | Disposition: A | Discharge status: Home / Self Care | Payer: BC Managed Care – PPO | Source: Ambulatory Visit | Attending: Family Medicine | Admitting: Family Medicine

## 2014-05-19 DIAGNOSIS — B3731 Acute candidiasis of vulva and vagina: Secondary | ICD-10-CM | POA: Diagnosis not present

## 2014-05-19 DIAGNOSIS — B373 Candidiasis of vulva and vagina: Secondary | ICD-10-CM | POA: Diagnosis not present

## 2014-05-19 DIAGNOSIS — L293 Anogenital pruritus, unspecified: Secondary | ICD-10-CM | POA: Diagnosis present

## 2014-05-19 LAB — WET PREP, GENITAL
Clue Cells Wet Prep HPF POC: NONE SEEN
Trich, Wet Prep: NONE SEEN
Yeast Wet Prep HPF POC: NONE SEEN

## 2014-05-19 MED ORDER — NYSTATIN-TRIAMCINOLONE 100000-0.1 UNIT/GM-% EX CREA
TOPICAL_CREAM | Freq: Once | CUTANEOUS | Status: AC
  Administered 2014-05-19: 18:00:00 via TOPICAL
  Filled 2014-05-19: qty 15

## 2014-05-19 MED ORDER — FLUCONAZOLE 150 MG PO TABS
150.0000 mg | ORAL_TABLET | Freq: Once | ORAL | Status: AC
  Administered 2014-05-19: 150 mg via ORAL
  Filled 2014-05-19: qty 1

## 2014-05-19 MED ORDER — FLUCONAZOLE 150 MG PO TABS: 150.0000 mg | ORAL_TABLET | Freq: Every day | ORAL | Status: DC

## 2014-05-19 MED ORDER — TRIAMCINOLONE ACETONIDE 0.1 % EX CREA: 1.0000 "application " | TOPICAL_CREAM | Freq: Two times a day (BID) | CUTANEOUS | Status: DC

## 2014-05-19 NOTE — Progress Notes (Signed)
Wet prep collected

## 2014-05-19 NOTE — Discharge Instructions (Signed)

## 2014-05-19 NOTE — MAU Note (Signed)
Patient states she was seen in MAU last week and treated for a yeast infection but has not worked. Has been using Monistat with a little relief but feels like it is still there.

## 2014-05-19 NOTE — MAU Note (Signed)
Pt states she had some itching and irritation for at least 1 wk pt states she was here. Pt states she had the pills and they did not.

## 2014-05-19 NOTE — MAU Provider Note (Signed)
  History     CSN: [id number]  Arrival date and time: 05/19/14 1618   First Provider Initiated Contact with Patient 05/19/14 1716      Chief Complaint  Patient presents with  . Vaginal Discharge   HPI Pt is not pregnant and presents with persistant vaginal itching and burning.  Pt was seen on 05/09/2014 in MAU and given DIflucan 150 and repeated another dose when she got home.  Pt states it did not help and has continued with intense burning and irritation Pt has recently used Monistat and used today at 12 noon Past Medical History  Diagnosis Date  . Medical history non-contributory     Past Surgical History  Procedure Laterality Date  . No past surgeries      History reviewed. No pertinent family history.  History  Substance Use Topics  . Smoking status: Never Smoker   . Smokeless tobacco: Never Used  . Alcohol Use: No    Allergies:  Allergies  Allergen Reactions  . Neosporin [Neomycin-Bacitracin Zn-Polymyx] Rash    Prescriptions prior to admission  Medication Sig Dispense Refill  . fluconazole (DIFLUCAN) 150 MG tablet Take 1 tablet (150 mg total) by mouth daily.  1 tablet  2  . Phenazopyridine HCl (AZO TABS PO) Take 1 tablet by mouth 3 (three) times daily as needed (for UTI symptoms).        Review of Systems  Constitutional: Negative for fever and chills.  Gastrointestinal: Negative for nausea, vomiting, abdominal pain, diarrhea and constipation.  Genitourinary: Positive for dysuria.   Physical Exam   Blood pressure 120/69, pulse 85, temperature 99.3 F (37.4 C), temperature source Oral, resp. rate 16, last menstrual period 04/19/2014, SpO2 97.00%, currently breastfeeding.  Physical Exam  Vitals reviewed. Constitutional: She appears well-developed and well-nourished. No distress.  HENT:  Head: Normocephalic.  Eyes: Pupils are equal, round, and reactive to light.  Neck: Normal range of motion. Neck supple.  Cardiovascular: Normal rate.   Respiratory:  Effort normal.  GI: Soft.  Genitourinary:  External labia raised very irritated with flesh colored rash; large amount of monistat cream inside vagina- difficult to assess; bimanual nontender- uterus NSSC NT; adnexa without palpable enlargement or tenderness    MAU Course  Procedures Results for orders placed during the hospital encounter of 05/19/14 (from the past 24 hour(s))  WET PREP, GENITAL     Status: Abnormal   Collection Time    05/19/14  5:27 PM      Result Value Ref Range   Yeast Wet Prep HPF POC NONE SEEN  NONE SEEN   Trich, Wet Prep NONE SEEN  NONE SEEN   Clue Cells Wet Prep HPF POC NONE SEEN  NONE SEEN   WBC, Wet Prep HPF POC FEW (*) NONE SEEN   Will treat clinically for yeast Diflucan  in MAU and Mycolog cream for external use  Assessment and Plan  Yeast vulvovaginitis- RX Diflucan 150 repeat in 3 days and another 3 days repeat if sx persist Rx for triamcinolone cream to use after Monistat if needed for irritation F/u with [facility] as previously arranged  Chad Donoghue 05/19/2014, 5:32 PM

## 2014-05-20 NOTE — MAU Provider Note (Signed)
Attestation of Attending Supervision of Advanced Practitioner (PA/CNM/NP): Evaluation and management procedures were performed by the Advanced Practitioner under my supervision and collaboration.  I have reviewed the Advanced Practitioner's note and chart, and I agree with the management and plan.  Donyale Falcon, DO Attending Physician Faculty Practice, [facility]  

## 2014-07-06 ENCOUNTER — Encounter (HOSPITAL_COMMUNITY): Payer: Self-pay | Admitting: *Deleted

## 2014-09-04 NOTE — L&D Delivery Note (Signed)
Delivery Note At 3:11 PM a viable female was delivered via  (Presentation: ;  ).  APGAR: , ; weight  .   Placenta status: , .  Cord:  with the following complications: .  Cord pH: not done  Anesthesia:   Episiotomy:   Lacerations:   Suture Repair: 2.0 vicryl Est. Blood Loss (mL):    Mom to postpartum.  Baby to Couplet care / Skin to Skin.  MARSHALL,BERNARD A 05/27/2015, 3:23 PM

## 2014-11-23 ENCOUNTER — Other Ambulatory Visit (HOSPITAL_COMMUNITY): Payer: Self-pay | Admitting: Obstetrics

## 2014-11-23 DIAGNOSIS — Z3682 Encounter for antenatal screening for nuchal translucency: Secondary | ICD-10-CM

## 2014-11-23 DIAGNOSIS — O09891 Supervision of other high risk pregnancies, first trimester: Secondary | ICD-10-CM

## 2014-11-23 DIAGNOSIS — O09211 Supervision of pregnancy with history of pre-term labor, first trimester: Secondary | ICD-10-CM

## 2014-11-23 LAB — PROCEDURE REPORT - SCANNED: Pap: ABNORMAL — AB

## 2014-11-26 LAB — OB RESULTS CONSOLE RPR: RPR: NONREACTIVE

## 2014-11-26 LAB — OB RESULTS CONSOLE HIV ANTIBODY (ROUTINE TESTING): HIV: NONREACTIVE

## 2014-12-18 ENCOUNTER — Ambulatory Visit (HOSPITAL_COMMUNITY)
Admission: RE | Admit: 2014-12-18 | Discharge: 2014-12-18 | Disposition: A | Discharge status: Home / Self Care | Payer: Medicaid Other | Source: Ambulatory Visit | Attending: Obstetrics | Admitting: Obstetrics

## 2014-12-18 ENCOUNTER — Ambulatory Visit (HOSPITAL_COMMUNITY): Admission: RE | Admit: 2014-12-18 | Payer: Medicaid Other | Source: Ambulatory Visit

## 2014-12-18 ENCOUNTER — Encounter (HOSPITAL_COMMUNITY): Payer: Self-pay

## 2014-12-18 ENCOUNTER — Other Ambulatory Visit (HOSPITAL_COMMUNITY): Payer: Self-pay | Admitting: Obstetrics

## 2014-12-18 ENCOUNTER — Other Ambulatory Visit (HOSPITAL_COMMUNITY): Payer: Self-pay | Admitting: Maternal and Fetal Medicine

## 2014-12-18 DIAGNOSIS — O09211 Supervision of pregnancy with history of pre-term labor, first trimester: Secondary | ICD-10-CM

## 2014-12-18 DIAGNOSIS — O09212 Supervision of pregnancy with history of pre-term labor, second trimester: Secondary | ICD-10-CM | POA: Insufficient documentation

## 2014-12-18 DIAGNOSIS — O09893 Supervision of other high risk pregnancies, third trimester: Secondary | ICD-10-CM | POA: Insufficient documentation

## 2014-12-18 DIAGNOSIS — Z36 Encounter for antenatal screening of mother: Secondary | ICD-10-CM | POA: Diagnosis present

## 2014-12-18 DIAGNOSIS — O09891 Supervision of other high risk pregnancies, first trimester: Secondary | ICD-10-CM

## 2014-12-18 DIAGNOSIS — Z3A17 17 weeks gestation of pregnancy: Secondary | ICD-10-CM | POA: Insufficient documentation

## 2014-12-18 DIAGNOSIS — Z3682 Encounter for antenatal screening for nuchal translucency: Secondary | ICD-10-CM

## 2014-12-18 DIAGNOSIS — Z3689 Encounter for other specified antenatal screening: Secondary | ICD-10-CM | POA: Insufficient documentation

## 2014-12-18 DIAGNOSIS — O09213 Supervision of pregnancy with history of pre-term labor, third trimester: Secondary | ICD-10-CM

## 2014-12-18 NOTE — ED Notes (Signed)
[facility] [address], per Dr. Claudean SeveranceWhitecar.

## 2014-12-24 ENCOUNTER — Ambulatory Visit (HOSPITAL_COMMUNITY)
Admission: RE | Admit: 2014-12-24 | Discharge: 2014-12-24 | Disposition: A | Discharge status: Home / Self Care | Payer: Medicaid Other | Source: Ambulatory Visit | Attending: Obstetrics | Admitting: Obstetrics

## 2014-12-24 DIAGNOSIS — O09211 Supervision of pregnancy with history of pre-term labor, first trimester: Secondary | ICD-10-CM | POA: Insufficient documentation

## 2014-12-24 DIAGNOSIS — O09891 Supervision of other high risk pregnancies, first trimester: Secondary | ICD-10-CM

## 2014-12-24 DIAGNOSIS — Z3A18 18 weeks gestation of pregnancy: Secondary | ICD-10-CM | POA: Insufficient documentation

## 2014-12-30 ENCOUNTER — Other Ambulatory Visit (HOSPITAL_COMMUNITY): Payer: Self-pay | Admitting: Maternal and Fetal Medicine

## 2014-12-30 DIAGNOSIS — Z8751 Personal history of pre-term labor: Secondary | ICD-10-CM

## 2014-12-30 DIAGNOSIS — O26872 Cervical shortening, second trimester: Secondary | ICD-10-CM

## 2015-01-01 ENCOUNTER — Encounter (HOSPITAL_COMMUNITY): Payer: Self-pay

## 2015-01-01 ENCOUNTER — Other Ambulatory Visit (HOSPITAL_COMMUNITY): Payer: Self-pay | Admitting: Obstetrics and Gynecology

## 2015-01-01 ENCOUNTER — Ambulatory Visit (HOSPITAL_COMMUNITY)
Admission: RE | Admit: 2015-01-01 | Discharge: 2015-01-01 | Disposition: A | Discharge status: Home / Self Care | Payer: Medicaid Other | Source: Ambulatory Visit | Attending: Obstetrics | Admitting: Obstetrics

## 2015-01-01 DIAGNOSIS — O09212 Supervision of pregnancy with history of pre-term labor, second trimester: Secondary | ICD-10-CM | POA: Diagnosis not present

## 2015-01-01 DIAGNOSIS — O26872 Cervical shortening, second trimester: Secondary | ICD-10-CM | POA: Insufficient documentation

## 2015-01-01 DIAGNOSIS — Z3A19 19 weeks gestation of pregnancy: Secondary | ICD-10-CM | POA: Insufficient documentation

## 2015-01-01 DIAGNOSIS — Z8751 Personal history of pre-term labor: Secondary | ICD-10-CM

## 2015-01-18 ENCOUNTER — Ambulatory Visit (HOSPITAL_COMMUNITY): Payer: Medicaid Other

## 2015-01-19 ENCOUNTER — Ambulatory Visit (HOSPITAL_COMMUNITY)
Admission: RE | Admit: 2015-01-19 | Discharge: 2015-01-19 | Disposition: A | Discharge status: Home / Self Care | Payer: Medicaid Other | Source: Ambulatory Visit | Attending: Obstetrics and Gynecology | Admitting: Obstetrics and Gynecology

## 2015-01-19 ENCOUNTER — Encounter (HOSPITAL_COMMUNITY): Payer: Self-pay

## 2015-01-19 DIAGNOSIS — Z3A21 21 weeks gestation of pregnancy: Secondary | ICD-10-CM | POA: Diagnosis not present

## 2015-01-19 DIAGNOSIS — O26872 Cervical shortening, second trimester: Secondary | ICD-10-CM | POA: Diagnosis present

## 2015-01-19 DIAGNOSIS — Z8751 Personal history of pre-term labor: Secondary | ICD-10-CM

## 2015-01-19 DIAGNOSIS — Z3A22 22 weeks gestation of pregnancy: Secondary | ICD-10-CM | POA: Insufficient documentation

## 2015-01-19 DIAGNOSIS — O09292 Supervision of pregnancy with other poor reproductive or obstetric history, second trimester: Secondary | ICD-10-CM | POA: Insufficient documentation

## 2015-01-19 DIAGNOSIS — O26879 Cervical shortening, unspecified trimester: Secondary | ICD-10-CM | POA: Insufficient documentation

## 2015-01-25 ENCOUNTER — Other Ambulatory Visit (HOSPITAL_COMMUNITY): Payer: Self-pay | Admitting: Obstetrics

## 2015-01-25 DIAGNOSIS — Z3A22 22 weeks gestation of pregnancy: Secondary | ICD-10-CM

## 2015-01-25 DIAGNOSIS — O26872 Cervical shortening, second trimester: Secondary | ICD-10-CM

## 2015-01-25 DIAGNOSIS — O09292 Supervision of pregnancy with other poor reproductive or obstetric history, second trimester: Secondary | ICD-10-CM

## 2015-01-26 ENCOUNTER — Ambulatory Visit (HOSPITAL_COMMUNITY)
Admission: RE | Admit: 2015-01-26 | Discharge: 2015-01-26 | Disposition: A | Discharge status: Home / Self Care | Payer: Medicaid Other | Source: Ambulatory Visit | Attending: Obstetrics | Admitting: Obstetrics

## 2015-01-26 ENCOUNTER — Other Ambulatory Visit (HOSPITAL_COMMUNITY): Payer: Self-pay | Admitting: Obstetrics

## 2015-01-26 DIAGNOSIS — O09292 Supervision of pregnancy with other poor reproductive or obstetric history, second trimester: Secondary | ICD-10-CM

## 2015-01-26 DIAGNOSIS — Z3A22 22 weeks gestation of pregnancy: Secondary | ICD-10-CM

## 2015-01-26 DIAGNOSIS — O09212 Supervision of pregnancy with history of pre-term labor, second trimester: Secondary | ICD-10-CM | POA: Insufficient documentation

## 2015-01-26 DIAGNOSIS — O26872 Cervical shortening, second trimester: Secondary | ICD-10-CM

## 2015-01-26 NOTE — ED Notes (Signed)
Called Dr. Elsie StainMarshall's office and [facility] setting patient up for 17-P injections and follow up appointment.  Instructed by Lianne CureJosie to have patient call her this afternoon.  Relayed message to patient who verbalized understanding.

## 2015-02-02 ENCOUNTER — Ambulatory Visit (HOSPITAL_COMMUNITY): Payer: Medicaid Other

## 2015-02-09 ENCOUNTER — Ambulatory Visit (HOSPITAL_COMMUNITY)
Admission: RE | Admit: 2015-02-09 | Discharge: 2015-02-09 | Disposition: A | Discharge status: Home / Self Care | Payer: Medicaid Other | Source: Ambulatory Visit | Attending: Obstetrics | Admitting: Obstetrics

## 2015-02-09 DIAGNOSIS — Z8751 Personal history of pre-term labor: Secondary | ICD-10-CM

## 2015-02-09 DIAGNOSIS — Z3A24 24 weeks gestation of pregnancy: Secondary | ICD-10-CM | POA: Diagnosis not present

## 2015-02-09 DIAGNOSIS — O26872 Cervical shortening, second trimester: Secondary | ICD-10-CM | POA: Diagnosis not present

## 2015-02-09 DIAGNOSIS — O09212 Supervision of pregnancy with history of pre-term labor, second trimester: Secondary | ICD-10-CM | POA: Diagnosis not present

## 2015-02-10 DIAGNOSIS — Z3A24 24 weeks gestation of pregnancy: Secondary | ICD-10-CM | POA: Insufficient documentation

## 2015-02-23 ENCOUNTER — Encounter (HOSPITAL_COMMUNITY): Payer: Self-pay

## 2015-02-23 ENCOUNTER — Ambulatory Visit (HOSPITAL_COMMUNITY)
Admission: RE | Admit: 2015-02-23 | Discharge: 2015-02-23 | Disposition: A | Discharge status: Home / Self Care | Payer: Medicaid Other | Source: Ambulatory Visit | Attending: Obstetrics | Admitting: Obstetrics

## 2015-02-23 DIAGNOSIS — Z3A26 26 weeks gestation of pregnancy: Secondary | ICD-10-CM | POA: Insufficient documentation

## 2015-02-23 DIAGNOSIS — O26872 Cervical shortening, second trimester: Secondary | ICD-10-CM | POA: Diagnosis present

## 2015-02-23 DIAGNOSIS — O09292 Supervision of pregnancy with other poor reproductive or obstetric history, second trimester: Secondary | ICD-10-CM | POA: Diagnosis not present

## 2015-02-23 DIAGNOSIS — O09299 Supervision of pregnancy with other poor reproductive or obstetric history, unspecified trimester: Secondary | ICD-10-CM | POA: Insufficient documentation

## 2015-02-23 MED ORDER — BETAMETHASONE SOD PHOS & ACET 6 (3-3) MG/ML IJ SUSP
12.0000 mg | Freq: Once | INTRAMUSCULAR | Status: AC
  Administered 2015-02-23: 12 mg via INTRAMUSCULAR
  Filled 2015-02-23: qty 2

## 2015-02-24 ENCOUNTER — Ambulatory Visit (HOSPITAL_COMMUNITY)
Admission: RE | Admit: 2015-02-24 | Discharge: 2015-02-24 | Disposition: A | Discharge status: Home / Self Care | Payer: Medicaid Other | Source: Ambulatory Visit | Attending: Obstetrics | Admitting: Obstetrics

## 2015-02-24 DIAGNOSIS — O09212 Supervision of pregnancy with history of pre-term labor, second trimester: Secondary | ICD-10-CM | POA: Insufficient documentation

## 2015-02-24 DIAGNOSIS — Z3A26 26 weeks gestation of pregnancy: Secondary | ICD-10-CM | POA: Insufficient documentation

## 2015-02-24 DIAGNOSIS — O26872 Cervical shortening, second trimester: Secondary | ICD-10-CM | POA: Diagnosis present

## 2015-02-24 MED ORDER — BETAMETHASONE SOD PHOS & ACET 6 (3-3) MG/ML IJ SUSP
12.0000 mg | Freq: Once | INTRAMUSCULAR | Status: AC
  Administered 2015-02-24: 12 mg via INTRAMUSCULAR
  Filled 2015-02-24: qty 2

## 2015-03-09 ENCOUNTER — Ambulatory Visit (HOSPITAL_COMMUNITY): Admission: RE | Admit: 2015-03-09 | Payer: Medicaid Other | Source: Ambulatory Visit

## 2015-03-27 ENCOUNTER — Encounter (HOSPITAL_COMMUNITY): Payer: Self-pay | Admitting: *Deleted

## 2015-03-27 ENCOUNTER — Inpatient Hospital Stay (HOSPITAL_COMMUNITY)
Admission: AD | Admit: 2015-03-27 | Discharge: 2015-03-27 | Disposition: A | Discharge status: Home / Self Care | Payer: Medicaid Other | Source: Ambulatory Visit | Attending: Obstetrics | Admitting: Obstetrics

## 2015-03-27 DIAGNOSIS — Z3493 Encounter for supervision of normal pregnancy, unspecified, third trimester: Secondary | ICD-10-CM | POA: Diagnosis present

## 2015-03-27 LAB — URINALYSIS, ROUTINE W REFLEX MICROSCOPIC
BILIRUBIN URINE: NEGATIVE
Glucose, UA: 100 mg/dL — AB
KETONES UR: NEGATIVE mg/dL
NITRITE: NEGATIVE
PH: 6 (ref 5.0–8.0)
Protein, ur: NEGATIVE mg/dL
Specific Gravity, Urine: 1.02 (ref 1.005–1.030)
Urobilinogen, UA: 0.2 mg/dL (ref 0.0–1.0)

## 2015-03-27 LAB — URINE MICROSCOPIC-ADD ON

## 2015-03-27 NOTE — Progress Notes (Signed)
Lori Clemmons CNM notified of pt's u/a results and fetal monitor strip. Occ u/i but no ctxs. Do not need to send FFN collected earlier and pt stable for d/c home.

## 2015-03-27 NOTE — Discharge Instructions (Signed)

## 2015-03-27 NOTE — MAU Note (Signed)
Up to [facility] and had small BM. States feels crampy like diarrhea but is not.

## 2015-03-27 NOTE — Progress Notes (Signed)
Written and verbal d/c instructions given and understanding voiced. 

## 2015-03-27 NOTE — MAU Note (Signed)
Awoke this am with contractions about 0430. Denies LOF or bleeding. Hx PTD at 27wks. Short cervix this pregnancy

## 2015-04-25 LAB — OB RESULTS CONSOLE GBS: STREP GROUP B AG: NEGATIVE

## 2015-05-25 ENCOUNTER — Encounter (HOSPITAL_COMMUNITY): Payer: Self-pay

## 2015-05-25 ENCOUNTER — Inpatient Hospital Stay (HOSPITAL_COMMUNITY)
Admission: AD | Admit: 2015-05-25 | Discharge: 2015-05-25 | Disposition: A | Discharge status: Home / Self Care | Payer: Medicaid Other | Source: Ambulatory Visit | Attending: Obstetrics | Admitting: Obstetrics

## 2015-05-25 DIAGNOSIS — Z3A39 39 weeks gestation of pregnancy: Secondary | ICD-10-CM | POA: Insufficient documentation

## 2015-05-25 DIAGNOSIS — O26893 Other specified pregnancy related conditions, third trimester: Secondary | ICD-10-CM | POA: Insufficient documentation

## 2015-05-25 DIAGNOSIS — L989 Disorder of the skin and subcutaneous tissue, unspecified: Secondary | ICD-10-CM | POA: Insufficient documentation

## 2015-05-25 DIAGNOSIS — R238 Other skin changes: Secondary | ICD-10-CM

## 2015-05-25 DIAGNOSIS — L293 Anogenital pruritus, unspecified: Secondary | ICD-10-CM | POA: Diagnosis present

## 2015-05-25 MED ORDER — HYDROCORTISONE 1 % EX CREA
TOPICAL_CREAM | Freq: Once | CUTANEOUS | Status: AC
  Administered 2015-05-25: 15:00:00 via TOPICAL
  Filled 2015-05-25: qty 28

## 2015-05-25 MED ORDER — TRIAMCINOLONE ACETONIDE 0.1 % EX CREA: TOPICAL_CREAM | Freq: Once | CUTANEOUS | Status: DC

## 2015-05-25 NOTE — Discharge Instructions (Signed)
You can use Cocoa Butter or any skin cream to help irritation from stretch marks  .Fetal Movement Counts Patient Name: __________________________________________________ Patient Due Date: ____________________ Performing a fetal movement count is highly recommended in high-risk pregnancies, but it is good for every pregnant woman to do. Your health care provider may ask you to start counting fetal movements at 28 weeks of the pregnancy. Fetal movements often increase:  After eating a full meal.  After physical activity.  After eating or drinking something sweet or cold.  At rest. Pay attention to when you feel the baby is most active. This will help you notice a pattern of your baby's sleep and wake cycles and what factors contribute to an increase in fetal movement. It is important to perform a fetal movement count at the same time each day when your baby is normally most active.  HOW TO COUNT FETAL MOVEMENTS  Find a quiet and comfortable area to sit or lie down on your left side. Lying on your left side provides the best blood and oxygen circulation to your baby.  Write down the day and time on a sheet of paper or in a journal.  Start counting kicks, flutters, swishes, rolls, or jabs in a 2-hour period. You should feel at least 10 movements within 2 hours.  If you do not feel 10 movements in 2 hours, wait 2-3 hours and count again. Look for a change in the pattern or not enough counts in 2 hours. SEEK MEDICAL CARE IF:  You feel less than 10 counts in 2 hours, tried twice.  There is no movement in over an hour.  The pattern is changing or taking longer each day to reach 10 counts in 2 hours.  You feel the baby is not moving as he or she usually does. Date: ____________ Movements: ____________ Start time: ____________ Doreatha Martin time: ____________  Date: ____________ Movements: ____________ Start time: ____________ Doreatha Martin time: ____________ Date: ____________ Movements: ____________  Start time: ____________ Doreatha Martin time: ____________ Date: ____________ Movements: ____________ Start time: ____________ Doreatha Martin time: ____________ Date: ____________ Movements: ____________ Start time: ____________ Doreatha Martin time: ____________ Date: ____________ Movements: ____________ Start time: ____________ Doreatha Martin time: ____________ Date: ____________ Movements: ____________ Start time: ____________ Doreatha Martin time: ____________ Date: ____________ Movements: ____________ Start time: ____________ Doreatha Martin time: ____________  Date: ____________ Movements: ____________ Start time: ____________ Doreatha Martin time: ____________ Date: ____________ Movements: ____________ Start time: ____________ Doreatha Martin time: ____________ Date: ____________ Movements: ____________ Start time: ____________ Doreatha Martin time: ____________ Date: ____________ Movements: ____________ Start time: ____________ Doreatha Martin time: ____________ Date: ____________ Movements: ____________ Start time: ____________ Doreatha Martin time: ____________ Date: ____________ Movements: ____________ Start time: ____________ Doreatha Martin time: ____________ Date: ____________ Movements: ____________ Start time: ____________ Doreatha Martin time: ____________  Date: ____________ Movements: ____________ Start time: ____________ Doreatha Martin time: ____________ Date: ____________ Movements: ____________ Start time: ____________ Doreatha Martin time: ____________ Date: ____________ Movements: ____________ Start time: ____________ Doreatha Martin time: ____________ Date: ____________ Movements: ____________ Start time: ____________ Doreatha Martin time: ____________ Date: ____________ Movements: ____________ Start time: ____________ Doreatha Martin time: ____________ Date: ____________ Movements: ____________ Start time: ____________ Doreatha Martin time: ____________ Date: ____________ Movements: ____________ Start time: ____________ Doreatha Martin time: ____________  Date: ____________ Movements: ____________ Start time: ____________ Doreatha Martin time:  ____________ Date: ____________ Movements: ____________ Start time: ____________ Doreatha Martin time: ____________ Date: ____________ Movements: ____________ Start time: ____________ Doreatha Martin time: ____________ Date: ____________ Movements: ____________ Start time: ____________ Doreatha Martin time: ____________ Date: ____________ Movements: ____________ Start time: ____________ Doreatha Martin time: ____________ Date: ____________ Movements: ____________ Start time: ____________ Doreatha Martin time: ____________ Date: ____________ Movements:  ____________ Start time: ____________ Doreatha Martin time: ____________  Date: ____________ Movements: ____________ Start time: ____________ Doreatha Martin time: ____________ Date: ____________ Movements: ____________ Start time: ____________ Doreatha Martin time: ____________ Date: ____________ Movements: ____________ Start time: ____________ Doreatha Martin time: ____________ Date: ____________ Movements: ____________ Start time: ____________ Doreatha Martin time: ____________ Date: ____________ Movements: ____________ Start time: ____________ Doreatha Martin time: ____________ Date: ____________ Movements: ____________ Start time: ____________ Doreatha Martin time: ____________ Date: ____________ Movements: ____________ Start time: ____________ Doreatha Martin time: ____________  Date: ____________ Movements: ____________ Start time: ____________ Doreatha Martin time: ____________ Date: ____________ Movements: ____________ Start time: ____________ Doreatha Martin time: ____________ Date: ____________ Movements: ____________ Start time: ____________ Doreatha Martin time: ____________ Date: ____________ Movements: ____________ Start time: ____________ Doreatha Martin time: ____________ Date: ____________ Movements: ____________ Start time: ____________ Doreatha Martin time: ____________ Date: ____________ Movements: ____________ Start time: ____________ Doreatha Martin time: ____________ Date: ____________ Movements: ____________ Start time: ____________ Doreatha Martin time: ____________  Date: ____________ Movements:  ____________ Start time: ____________ Doreatha Martin time: ____________ Date: ____________ Movements: ____________ Start time: ____________ Doreatha Martin time: ____________ Date: ____________ Movements: ____________ Start time: ____________ Doreatha Martin time: ____________ Date: ____________ Movements: ____________ Start time: ____________ Doreatha Martin time: ____________ Date: ____________ Movements: ____________ Start time: ____________ Doreatha Martin time: ____________ Date: ____________ Movements: ____________ Start time: ____________ Doreatha Martin time: ____________ Date: ____________ Movements: ____________ Start time: ____________ Doreatha Martin time: ____________  Date: ____________ Movements: ____________ Start time: ____________ Doreatha Martin time: ____________ Date: ____________ Movements: ____________ Start time: ____________ Doreatha Martin time: ____________ Date: ____________ Movements: ____________ Start time: ____________ Doreatha Martin time: ____________ Date: ____________ Movements: ____________ Start time: ____________ Doreatha Martin time: ____________ Date: ____________ Movements: ____________ Start time: ____________ Doreatha Martin time: ____________ Date: ____________ Movements: ____________ Start time: ____________ Doreatha Martin time: ____________ Document Released: 09/20/2006 Document Revised: 01/05/2014 Document Reviewed: 06/17/2012 [employer] Patient Information 2015 [employer]. This information is not intended to replace advice given to you by your health care provider. Make sure you discuss any questions you have with your health care provider. Third Trimester of Pregnancy The third trimester is from week 29 through week 42, months 7 through 9. The third trimester is a time when the fetus is growing rapidly. At the end of the ninth month, the fetus is about 20 inches in length and weighs 6-10 pounds.  BODY CHANGES Your body goes through many changes during pregnancy. The changes vary from woman to woman.   Your weight will continue to increase. You can expect to  gain 25-35 pounds (11-16 kg) by the end of the pregnancy.  You may begin to get stretch marks on your hips, abdomen, and breasts.  You may urinate more often because the fetus is moving lower into your pelvis and pressing on your bladder.  You may develop or continue to have heartburn as a result of your pregnancy.  You may develop constipation because certain hormones are causing the muscles that push waste through your intestines to slow down.  You may develop hemorrhoids or swollen, bulging veins (varicose veins).  You may have pelvic pain because of the weight gain and pregnancy hormones relaxing your joints between the bones in your pelvis. Backaches may result from overexertion of the muscles supporting your posture.  You may have changes in your hair. These can include thickening of your hair, rapid growth, and changes in texture. Some women also have hair loss during or after pregnancy, or hair that feels dry or thin. Your hair will most likely return to normal after your baby is born.  Your breasts will continue to grow and be tender. A yellow discharge may leak from your  breasts called colostrum.  Your belly button may stick out.  You may feel short of breath because of your expanding uterus.  You may notice the fetus "dropping," or moving lower in your abdomen.  You may have a bloody mucus discharge. This usually occurs a few days to a week before labor begins.  Your cervix becomes thin and soft (effaced) near your due date. WHAT TO EXPECT AT YOUR PRENATAL EXAMS  You will have prenatal exams every 2 weeks until week 36. Then, you will have weekly prenatal exams. During a routine prenatal visit:  You will be weighed to make sure you and the fetus are growing normally.  Your blood pressure is taken.  Your abdomen will be measured to track your baby's growth.  The fetal heartbeat will be listened to.  Any test results from the previous visit will be discussed.  You may  have a cervical check near your due date to see if you have effaced. At around 36 weeks, your caregiver will check your cervix. At the same time, your caregiver will also perform a test on the secretions of the vaginal tissue. This test is to determine if a type of bacteria, Group B streptococcus, is present. Your caregiver will explain this further. Your caregiver may ask you:  What your birth plan is.  How you are feeling.  If you are feeling the baby move.  If you have had any abnormal symptoms, such as leaking fluid, bleeding, severe headaches, or abdominal cramping.  If you have any questions. Other tests or screenings that may be performed during your third trimester include:  Blood tests that check for low iron levels (anemia).  Fetal testing to check the health, activity level, and growth of the fetus. Testing is done if you have certain medical conditions or if there are problems during the pregnancy. FALSE LABOR You may feel small, irregular contractions that eventually go away. These are called Braxton Hicks contractions, or false labor. Contractions may last for hours, days, or even weeks before true labor sets in. If contractions come at regular intervals, intensify, or become painful, it is best to be seen by your caregiver.  SIGNS OF LABOR   Menstrual-like cramps.  Contractions that are 5 minutes apart or less.  Contractions that start on the top of the uterus and spread down to the lower abdomen and back.  A sense of increased pelvic pressure or back pain.  A watery or bloody mucus discharge that comes from the vagina. If you have any of these signs before the 37th week of pregnancy, call your caregiver right away. You need to go to the hospital to get checked immediately. HOME CARE INSTRUCTIONS   Avoid all smoking, herbs, alcohol, and unprescribed drugs. These chemicals affect the formation and growth of the baby.  Follow your caregiver's instructions regarding  medicine use. There are medicines that are either safe or unsafe to take during pregnancy.  Exercise only as directed by your caregiver. Experiencing uterine cramps is a good sign to stop exercising.  Continue to eat regular, healthy meals.  Wear a good support bra for breast tenderness.  Do not use hot tubs, steam rooms, or saunas.  Wear your seat belt at all times when driving.  Avoid raw meat, uncooked cheese, cat litter boxes, and soil used by cats. These carry germs that can cause birth defects in the baby.  Take your prenatal vitamins.  Try taking a stool softener (if your caregiver approves) if you  develop constipation. Eat more high-fiber foods, such as fresh vegetables or fruit and whole grains. Drink plenty of fluids to keep your urine clear or pale yellow.  Take warm sitz baths to soothe any pain or discomfort caused by hemorrhoids. Use hemorrhoid cream if your caregiver approves.  If you develop varicose veins, wear support hose. Elevate your feet for 15 minutes, 3-4 times a day. Limit salt in your diet.  Avoid heavy lifting, wear low heal shoes, and practice good posture.  Rest a lot with your legs elevated if you have leg cramps or low back pain.  Visit your dentist if you have not gone during your pregnancy. Use a soft toothbrush to brush your teeth and be gentle when you floss.  A sexual relationship may be continued unless your caregiver directs you otherwise.  Do not travel far distances unless it is absolutely necessary and only with the approval of your caregiver.  Take prenatal classes to understand, practice, and ask questions about the labor and delivery.  Make a trial run to the hospital.  Pack your hospital bag.  Prepare the baby's nursery.  Continue to go to all your prenatal visits as directed by your caregiver. SEEK MEDICAL CARE IF:  You are unsure if you are in labor or if your water has broken.  You have dizziness.  You have mild pelvic  cramps, pelvic pressure, or nagging pain in your abdominal area.  You have persistent nausea, vomiting, or diarrhea.  You have a bad smelling vaginal discharge.  You have pain with urination. SEEK IMMEDIATE MEDICAL CARE IF:   You have a fever.  You are leaking fluid from your vagina.  You have spotting or bleeding from your vagina.  You have severe abdominal cramping or pain.  You have rapid weight loss or gain.  You have shortness of breath with chest pain.  You notice sudden or extreme swelling of your face, hands, ankles, feet, or legs.  You have not felt your baby move in over an hour.  You have severe headaches that do not go away with medicine.  You have vision changes. Document Released: 08/15/2001 Document Revised: 08/26/2013 Document Reviewed: 10/22/2012 [facility] Patient Information 2015 [place], [place]. This information is not intended to replace advice given to you by your health care provider. Make sure you discuss any questions you have with your health care provider.

## 2015-05-25 NOTE — MAU Note (Addendum)
Pt. State she is having horrible itching only on her abdomen. Has hairy stomach. No rash noted.  Denies vaginal bleeding or leaking .

## 2015-05-25 NOTE — MAU Provider Note (Signed)
  History     CSN: [id number]  Arrival date and time: 05/25/15 1330   None     Chief Complaint  Patient presents with  . Pruritis   HPI Pt is [id number]  pregnant who presents with localized itching where stretch marks appear. Pt states she has just recently gotten stretch marks.  Pt has used some lotion without any relief. Pt denies any spotting, bleeding, abd pain, nausea, vomiting or headaches. RN note: Pt. State she is having horrible itching only on her abdomen. Denies vaginal bleeding or leaking Past Medical History  Diagnosis Date  . Medical history non-contributory     Past Surgical History  Procedure Laterality Date  . No past surgeries      History reviewed. No pertinent family history.  Social History  Substance Use Topics  . Smoking status: Never Smoker   . Smokeless tobacco: Never Used  . Alcohol Use: No    Allergies:  Allergies  Allergen Reactions  . Neosporin [Neomycin-Bacitracin Zn-Polymyx] Rash    Prescriptions prior to admission  Medication Sig Dispense Refill Last Dose  . fluconazole (DIFLUCAN) 150 MG tablet Take 1 tablet (150 mg total) by mouth daily. (Patient not taking: Reported on 01/19/2015) 1 tablet 2 Not Taking  . fluconazole (DIFLUCAN) 150 MG tablet Take 1 tablet (150 mg total) by mouth daily. (Patient not taking: Reported on 01/19/2015) 2 tablet 1 Not Taking  . hydroxyprogesterone caproate (DELALUTIN) 250 mg/mL OIL injection Inject 250 mg into the muscle once.   Past Week at Unknown time  . Phenazopyridine HCl (AZO TABS PO) Take 1 tablet by mouth 3 (three) times daily as needed (for UTI symptoms).   Not Taking  . progesterone (ENDOMETRIN) 100 MG vaginal insert Place 100 mg vaginally 2 (two) times daily.   Taking  . triamcinolone cream (KENALOG) 0.1 % Apply 1 application topically 2 (two) times daily. (Patient not taking: Reported on 01/19/2015) 30 g 0 Not Taking    Review of Systems  Constitutional: Negative for fever and chills.   Gastrointestinal: Negative for heartburn, nausea, vomiting, abdominal pain, diarrhea and constipation.  Genitourinary: Negative for dysuria and urgency.   Physical Exam   Blood pressure 121/61, pulse 99, temperature 98.1 F (36.7 C), resp. rate 20, height  (1.626 m), weight 202 lb (91.627 kg), last menstrual period 09/19/2014, currently breastfeeding.  Physical Exam  Nursing note and vitals reviewed. Constitutional: She is oriented to person, place, and time. She appears well-developed and well-nourished. No distress.  HENT:  Head: Normocephalic.  Eyes: Pupils are equal, round, and reactive to light.  Neck: Normal range of motion. Neck supple.  Cardiovascular: Normal rate.   Respiratory: Effort normal. No respiratory distress. She has no wheezes. She has no rales.  GI: Soft. She exhibits no distension. There is no tenderness. There is no rebound.  Musculoskeletal: Normal range of motion.  Neurological: She is alert and oriented to person, place, and time.  Skin: Skin is warm and dry.  Psychiatric: She has a normal mood and affect.    MAU Course  Procedures Hydrocortisone cream applied to abdominal striae NST reactive No decelerations, no ctx noted   Assessment and Plan  Skin irritation secondary to striae in 3rd trimester pregnancy- hydrocortisone cream; emoillient creams recommeded Reactive NST; baseline 145 bpm; no decelerations noted; occ mild ctx F/u with dr. Gaynell Face for scheduled appointment in 2 days- return sooner if labor or concerns Kick counts form given  LINEBERRY,SUSAN 05/25/2015, 2:10 PM

## 2015-05-27 ENCOUNTER — Encounter (HOSPITAL_COMMUNITY): Payer: Self-pay

## 2015-05-27 ENCOUNTER — Inpatient Hospital Stay (HOSPITAL_COMMUNITY)
Admission: AD | Admit: 2015-05-27 | Discharge: 2015-05-29 | DRG: 775 | Disposition: A | Discharge status: Home / Self Care | Payer: Medicaid Other | Source: Ambulatory Visit | Attending: Obstetrics | Admitting: Obstetrics

## 2015-05-27 DIAGNOSIS — O480 Post-term pregnancy: Principal | ICD-10-CM | POA: Diagnosis present

## 2015-05-27 DIAGNOSIS — IMO0001 Reserved for inherently not codable concepts without codable children: Secondary | ICD-10-CM

## 2015-05-27 DIAGNOSIS — Z3A40 40 weeks gestation of pregnancy: Secondary | ICD-10-CM | POA: Diagnosis present

## 2015-05-27 LAB — TYPE AND SCREEN
ABO/RH(D): B POS
ANTIBODY SCREEN: NEGATIVE

## 2015-05-27 LAB — CBC
HCT: 38.8 % (ref 36.0–46.0)
HEMOGLOBIN: 13 g/dL (ref 12.0–15.0)
MCH: 27.7 pg (ref 26.0–34.0)
MCHC: 33.5 g/dL (ref 30.0–36.0)
MCV: 82.6 fL (ref 78.0–100.0)
Platelets: 273 10*3/uL (ref 150–400)
RBC: 4.7 MIL/uL (ref 3.87–5.11)
RDW: 14.7 % (ref 11.5–15.5)
WBC: 15.5 10*3/uL — ABNORMAL HIGH (ref 4.0–10.5)

## 2015-05-27 MED ORDER — CITRIC ACID-SODIUM CITRATE 334-500 MG/5ML PO SOLN: 30.0000 mL | ORAL | Status: DC | PRN

## 2015-05-27 MED ORDER — ONDANSETRON HCL 4 MG PO TABS: 4.0000 mg | ORAL_TABLET | ORAL | Status: DC | PRN

## 2015-05-27 MED ORDER — DIPHENHYDRAMINE HCL 50 MG/ML IJ SOLN: 12.5000 mg | INTRAMUSCULAR | Status: DC | PRN

## 2015-05-27 MED ORDER — BENZOCAINE-MENTHOL 20-0.5 % EX AERO
1.0000 "application " | INHALATION_SPRAY | CUTANEOUS | Status: DC | PRN
  Administered 2015-05-27: 1 via TOPICAL
  Filled 2015-05-27: qty 56

## 2015-05-27 MED ORDER — ACETAMINOPHEN 325 MG PO TABS: 650.0000 mg | ORAL_TABLET | ORAL | Status: DC | PRN

## 2015-05-27 MED ORDER — OXYCODONE-ACETAMINOPHEN 5-325 MG PO TABS: 2.0000 | ORAL_TABLET | ORAL | Status: DC | PRN

## 2015-05-27 MED ORDER — EPHEDRINE 5 MG/ML INJ
10.0000 mg | INTRAVENOUS | Status: DC | PRN
  Filled 2015-05-27: qty 2

## 2015-05-27 MED ORDER — OXYCODONE-ACETAMINOPHEN 5-325 MG PO TABS
1.0000 | ORAL_TABLET | ORAL | Status: DC | PRN
  Administered 2015-05-27: 1 via ORAL
  Filled 2015-05-27 (×3): qty 1

## 2015-05-27 MED ORDER — SENNOSIDES-DOCUSATE SODIUM 8.6-50 MG PO TABS
2.0000 | ORAL_TABLET | ORAL | Status: DC
  Administered 2015-05-27 – 2015-05-28 (×2): 2 via ORAL
  Filled 2015-05-27 (×2): qty 2

## 2015-05-27 MED ORDER — LIDOCAINE HCL (PF) 1 % IJ SOLN
30.0000 mL | INTRAMUSCULAR | Status: DC | PRN
  Administered 2015-05-27: 30 mL via SUBCUTANEOUS
  Filled 2015-05-27: qty 30

## 2015-05-27 MED ORDER — LACTATED RINGERS IV SOLN: 500.0000 mL | INTRAVENOUS | Status: DC | PRN

## 2015-05-27 MED ORDER — FENTANYL 2.5 MCG/ML BUPIVACAINE 1/10 % EPIDURAL INFUSION (WH - ANES): 14.0000 mL/h | INTRAMUSCULAR | Status: DC | PRN

## 2015-05-27 MED ORDER — ONDANSETRON HCL 4 MG/2ML IJ SOLN: 4.0000 mg | Freq: Four times a day (QID) | INTRAMUSCULAR | Status: DC | PRN

## 2015-05-27 MED ORDER — ZOLPIDEM TARTRATE 5 MG PO TABS: 5.0000 mg | ORAL_TABLET | Freq: Every evening | ORAL | Status: DC | PRN

## 2015-05-27 MED ORDER — TETANUS-DIPHTH-ACELL PERTUSSIS 5-2.5-18.5 LF-MCG/0.5 IM SUSP: 0.5000 mL | Freq: Once | INTRAMUSCULAR | Status: DC

## 2015-05-27 MED ORDER — SIMETHICONE 80 MG PO CHEW: 80.0000 mg | CHEWABLE_TABLET | ORAL | Status: DC | PRN

## 2015-05-27 MED ORDER — FLEET ENEMA 7-19 GM/118ML RE ENEM: 1.0000 | ENEMA | RECTAL | Status: DC | PRN

## 2015-05-27 MED ORDER — DIBUCAINE 1 % RE OINT: 1.0000 "application " | TOPICAL_OINTMENT | RECTAL | Status: DC | PRN

## 2015-05-27 MED ORDER — LANOLIN HYDROUS EX OINT: TOPICAL_OINTMENT | CUTANEOUS | Status: DC | PRN

## 2015-05-27 MED ORDER — ONDANSETRON HCL 4 MG/2ML IJ SOLN: 4.0000 mg | INTRAMUSCULAR | Status: DC | PRN

## 2015-05-27 MED ORDER — OXYCODONE-ACETAMINOPHEN 5-325 MG PO TABS
1.0000 | ORAL_TABLET | ORAL | Status: DC | PRN
  Administered 2015-05-28 – 2015-05-29 (×2): 1 via ORAL

## 2015-05-27 MED ORDER — INFLUENZA VAC SPLIT QUAD 0.5 ML IM SUSY
0.5000 mL | PREFILLED_SYRINGE | INTRAMUSCULAR | Status: AC
  Administered 2015-05-28: 0.5 mL via INTRAMUSCULAR
  Filled 2015-05-27: qty 0.5

## 2015-05-27 MED ORDER — DIPHENHYDRAMINE HCL 25 MG PO CAPS: 25.0000 mg | ORAL_CAPSULE | Freq: Four times a day (QID) | ORAL | Status: DC | PRN

## 2015-05-27 MED ORDER — OXYTOCIN 40 UNITS IN LACTATED RINGERS INFUSION - SIMPLE MED
62.5000 mL/h | INTRAVENOUS | Status: DC
  Administered 2015-05-27: 62.5 mL/h via INTRAVENOUS
  Filled 2015-05-27: qty 1000

## 2015-05-27 MED ORDER — WITCH HAZEL-GLYCERIN EX PADS: 1.0000 "application " | MEDICATED_PAD | CUTANEOUS | Status: DC | PRN

## 2015-05-27 MED ORDER — OXYTOCIN BOLUS FROM INFUSION: 500.0000 mL | INTRAVENOUS | Status: DC

## 2015-05-27 MED ORDER — BUTORPHANOL TARTRATE 1 MG/ML IJ SOLN
1.0000 mg | INTRAMUSCULAR | Status: DC | PRN
Start: 2015-05-27 — End: 2015-05-27
  Administered 2015-05-27: 1 mg via INTRAVENOUS
  Filled 2015-05-27: qty 1

## 2015-05-27 MED ORDER — PRENATAL MULTIVITAMIN CH
1.0000 | ORAL_TABLET | Freq: Every day | ORAL | Status: DC
  Administered 2015-05-28: 1 via ORAL
  Filled 2015-05-27: qty 1

## 2015-05-27 MED ORDER — LACTATED RINGERS IV SOLN
INTRAVENOUS | Status: DC
  Administered 2015-05-27: 12:00:00 via INTRAVENOUS

## 2015-05-27 MED ORDER — IBUPROFEN 600 MG PO TABS
600.0000 mg | ORAL_TABLET | Freq: Four times a day (QID) | ORAL | Status: DC
  Administered 2015-05-27 – 2015-05-29 (×7): 600 mg via ORAL
  Filled 2015-05-27 (×7): qty 1

## 2015-05-27 MED ORDER — FERROUS SULFATE 325 (65 FE) MG PO TABS
325.0000 mg | ORAL_TABLET | Freq: Two times a day (BID) | ORAL | Status: DC
  Administered 2015-05-27 – 2015-05-29 (×4): 325 mg via ORAL
  Filled 2015-05-27 (×4): qty 1

## 2015-05-27 MED ORDER — PHENYLEPHRINE 40 MCG/ML (10ML) SYRINGE FOR IV PUSH (FOR BLOOD PRESSURE SUPPORT)
80.0000 ug | PREFILLED_SYRINGE | INTRAVENOUS | Status: DC | PRN
  Filled 2015-05-27: qty 2

## 2015-05-27 NOTE — H&P (Signed)
This is Dr. Lita Mains dictating the history and physical  On Kim Robertson  she is a 34 year old to par outline at 40 weeks EDC 9 08/06/2015 negative U Perez admitted in labor she is dull 6 cm 100% vertex -1 station amniotomy performed and the fluid was clear negative GBS Past medical history negative Past surgical history negative Social history negative System review negative Physical exam well-developed female in labor HEENT negative Lungs clear to P&A Heart regular rhythm no murmurs or gallops Breasts negative Abdomen term Pelvic as described above Extremities negative

## 2015-05-27 NOTE — MAU Note (Signed)
Pt reports ctx since this morning. 61fcm yesterday in office. Denies SROM or bleeding

## 2015-05-28 LAB — CBC
HEMATOCRIT: 33.9 % — AB (ref 36.0–46.0)
Hemoglobin: 11.2 g/dL — ABNORMAL LOW (ref 12.0–15.0)
MCH: 26.9 pg (ref 26.0–34.0)
MCHC: 33 g/dL (ref 30.0–36.0)
MCV: 81.5 fL (ref 78.0–100.0)
Platelets: 240 10*3/uL (ref 150–400)
RBC: 4.16 MIL/uL (ref 3.87–5.11)
RDW: 14.7 % (ref 11.5–15.5)
WBC: 21.7 10*3/uL — ABNORMAL HIGH (ref 4.0–10.5)

## 2015-05-28 LAB — RPR: RPR Ser Ql: NONREACTIVE

## 2015-05-28 NOTE — Progress Notes (Signed)
Patient ID: Kim Robertson, female   DOB: May 09, 1981, 34 y.o.   MRN: [id number] Postpartum day one Blood pressure 113/63 respiration 18 pulse 72 afebrile Fundus firm lochia moderate Legs negative doing well

## 2015-05-28 NOTE — Lactation Note (Signed)
This note was copied from the chart of Kim Robertson. Lactation Consultation Note Initial visit at 27 hours of age.  Mom is experienced with breastfeeding and denies concerns at this time.   Mom reports frequent good feedings, baby is asleep in crib now.  [facility] LC resources given and discussed.  Encouraged to feed with early cues on demand.  Early newborn behavior discussed.  Hand expression reported with colostrum visible, encouraged mom to hand express prior to latch on and after feedings.  Mom to call for assist as needed.    Patient Name: Kim Jahdai Padovano [id number] Date: 05/28/2015 Reason for consult: Initial assessment   Maternal Data Has patient been taught Hand Expression?: Yes Does the patient have breastfeeding experience prior to this delivery?: Yes  Feeding Feeding Type: Breast Fed  LATCH Score/Interventions                Intervention(s): Breastfeeding basics reviewed     Lactation Tools Discussed/Used WIC Program: Yes   Consult Status Consult Status: Follow-up Date: 05/29/15 Follow-up type: In-patient    Shoptaw, Arvella Merles 05/28/2015, 6:21 PM

## 2015-05-29 MED ORDER — IBUPROFEN 600 MG PO TABS: 600.0000 mg | ORAL_TABLET | Freq: Four times a day (QID) | ORAL | Status: DC | PRN

## 2015-05-29 MED ORDER — OXYCODONE-ACETAMINOPHEN 5-325 MG PO TABS: 1.0000 | ORAL_TABLET | ORAL | Status: DC | PRN

## 2015-05-29 NOTE — Discharge Summary (Signed)
Obstetric Discharge Summary Reason for Admission: onset of labor Prenatal Procedures: ultrasound Intrapartum Procedures: spontaneous vaginal delivery Postpartum Procedures: none Complications-Operative and Postpartum: none HEMOGLOBIN  Date Value Ref Range Status  05/28/2015 11.2* 12.0 - 15.0 g/dL Final   HCT  Date Value Ref Range Status  05/28/2015 33.9* 36.0 - 46.0 % Final    Physical Exam:  General: alert and no distress Lochia: appropriate Uterine Fundus: firm Incision: none DVT Evaluation: No evidence of DVT seen on physical exam.  Discharge Diagnoses: Term Pregnancy-delivered  Discharge Information: Date: 05/29/2015 Activity: pelvic rest Diet: routine Medications: PNV, Ibuprofen, Colace, Iron and Percocet Condition: stable Instructions: refer to practice specific booklet Discharge to: home Follow-up Information    Follow up with MARSHALL,BERNARD A, MD. Schedule an appointment as soon as possible for a visit in 6 weeks.   Specialty:  Obstetrics and Gynecology   Contact information:   [address] [place] [address] [phone]       Newborn Data: Live born female  Birth Weight: 8 lb 10.3 oz (3920 g) APGAR: 8, 9  Home with mother.  HARPER,CHARLES A 05/29/2015, 6:55 AM

## 2015-05-29 NOTE — Progress Notes (Signed)
Post Partum Day 2 Subjective: no complaints  Objective: Blood pressure 96/54, pulse 71, temperature 97.7 F (36.5 C), temperature source Oral, resp. rate 18, height  (1.626 m), weight 202 lb (91.627 kg), last menstrual period 09/19/2014, SpO2 100 %, unknown if currently breastfeeding.  Physical Exam:  General: alert and no distress Lochia: appropriate Uterine Fundus: firm Incision: none DVT Evaluation: No evidence of DVT seen on physical exam.   Recent Labs  05/27/15 1210 05/28/15 0545  HGB 13.0 11.2*  HCT 38.8 33.9*    Assessment/Plan: Discharge home   LOS: 2 days   HARPER,CHARLES A 05/29/2015, 6:51 AM

## 2015-06-01 ENCOUNTER — Inpatient Hospital Stay (HOSPITAL_COMMUNITY)
Admission: AD | Admit: 2015-06-01 | Discharge: 2015-06-02 | Disposition: A | Discharge status: Home / Self Care | Payer: Medicaid Other | Source: Ambulatory Visit | Attending: Obstetrics | Admitting: Obstetrics

## 2015-06-01 DIAGNOSIS — O9089 Other complications of the puerperium, not elsewhere classified: Secondary | ICD-10-CM | POA: Insufficient documentation

## 2015-06-01 DIAGNOSIS — L239 Allergic contact dermatitis, unspecified cause: Secondary | ICD-10-CM | POA: Insufficient documentation

## 2015-06-01 NOTE — MAU Note (Signed)
Pt reports rash and itching all over her body. S/p vaginal delivery on 09/22. Is currently taking percocet.

## 2015-06-02 ENCOUNTER — Encounter (HOSPITAL_COMMUNITY): Payer: Self-pay | Admitting: *Deleted

## 2015-06-02 DIAGNOSIS — R21 Rash and other nonspecific skin eruption: Secondary | ICD-10-CM | POA: Diagnosis present

## 2015-06-02 DIAGNOSIS — L239 Allergic contact dermatitis, unspecified cause: Secondary | ICD-10-CM | POA: Diagnosis not present

## 2015-06-02 DIAGNOSIS — L200 Besnier's prurigo: Secondary | ICD-10-CM

## 2015-06-02 DIAGNOSIS — O9089 Other complications of the puerperium, not elsewhere classified: Secondary | ICD-10-CM | POA: Diagnosis not present

## 2015-06-02 LAB — CBC WITH DIFFERENTIAL/PLATELET
BASOS PCT: 0 %
Basophils Absolute: 0 10*3/uL (ref 0.0–0.1)
EOS ABS: 0.5 10*3/uL (ref 0.0–0.7)
Eosinophils Relative: 5 %
HCT: 31.9 % — ABNORMAL LOW (ref 36.0–46.0)
Hemoglobin: 10.4 g/dL — ABNORMAL LOW (ref 12.0–15.0)
Lymphocytes Relative: 21 %
Lymphs Abs: 2.2 10*3/uL (ref 0.7–4.0)
MCH: 26.9 pg (ref 26.0–34.0)
MCHC: 32.6 g/dL (ref 30.0–36.0)
MCV: 82.6 fL (ref 78.0–100.0)
MONO ABS: 0.8 10*3/uL (ref 0.1–1.0)
MONOS PCT: 8 %
Neutro Abs: 7 10*3/uL (ref 1.7–7.7)
Neutrophils Relative %: 66 %
Platelets: 276 10*3/uL (ref 150–400)
RBC: 3.86 MIL/uL — ABNORMAL LOW (ref 3.87–5.11)
RDW: 15.1 % (ref 11.5–15.5)
WBC: 10.5 10*3/uL (ref 4.0–10.5)

## 2015-06-02 LAB — COMPREHENSIVE METABOLIC PANEL
ALBUMIN: 3.1 g/dL — AB (ref 3.5–5.0)
ALT: 21 U/L (ref 14–54)
ANION GAP: 6 (ref 5–15)
AST: 20 U/L (ref 15–41)
Alkaline Phosphatase: 129 U/L — ABNORMAL HIGH (ref 38–126)
BILIRUBIN TOTAL: 0.5 mg/dL (ref 0.3–1.2)
BUN: 16 mg/dL (ref 6–20)
CO2: 23 mmol/L (ref 22–32)
Calcium: 8.6 mg/dL — ABNORMAL LOW (ref 8.9–10.3)
Chloride: 108 mmol/L (ref 101–111)
Creatinine, Ser: 0.8 mg/dL (ref 0.44–1.00)
GFR calc Af Amer: >60 mL/min (ref >60)
GFR calc non Af Amer: >60 mL/min (ref >60)
GLUCOSE: 93 mg/dL (ref 65–99)
POTASSIUM: 3.6 mmol/L (ref 3.5–5.1)
SODIUM: 137 mmol/L (ref 135–145)
TOTAL PROTEIN: 6.3 g/dL — AB (ref 6.5–8.1)

## 2015-06-02 MED ORDER — HYDROXYZINE HCL 25 MG PO TABS: 25.0000 mg | ORAL_TABLET | Freq: Four times a day (QID) | ORAL | Status: DC | PRN

## 2015-06-02 NOTE — MAU Provider Note (Signed)
History     CSN: [id number]  Arrival date and time: 06/01/15 2329   First Provider Initiated Contact with Patient 06/02/15 0004      No chief complaint on file.  HPI Ms. Kim Robertson is a 34 y.o. [id number] who is PPD #5 after SVD presents to MAU today with complaint of rash and itching. She states that she had mild itching of the abdomen just prior to coming into the hospital for delivery. Since she left the hospital she states that she has noted rash on the arms, chest and legs. She states that it is very itchy. She denies swelling of the throat, tongue or lips. She denies difficulty breathing. She denies known allergic contact. She denies changing any products at home, but has recently been exposed to the cleaning products of the hospital while inpatient.   OB History    Gravida Para Term Preterm AB TAB SAB Ectopic Multiple Living   0 2      Past Medical History  Diagnosis Date  . Preterm labor     Past Surgical History  Procedure Laterality Date  . No past surgeries      History reviewed. No pertinent family history.  Social History  Substance Use Topics  . Smoking status: Never Smoker   . Smokeless tobacco: Never Used  . Alcohol Use: No    Allergies:  Allergies  Allergen Reactions  . Neosporin [Neomycin-Bacitracin Zn-Polymyx] Rash    No prescriptions prior to admission    Review of Systems  Constitutional: Negative for fever and malaise/fatigue.  Gastrointestinal: Negative for abdominal pain.  Genitourinary:       + vaginal bleeding  Skin: Positive for itching and rash.   Physical Exam   Blood pressure 118/73, pulse 76, temperature 98.3 F (36.8 C), temperature source Oral, resp. rate 18, SpO2 100 %, unknown if currently breastfeeding.  Physical Exam  Nursing note and vitals reviewed. Constitutional: She is oriented to person, place, and time. She appears well-developed and well-nourished. No distress.  HENT:  Head: Normocephalic and  atraumatic.  Cardiovascular: Normal rate.   Respiratory: Effort normal.  GI: Soft. She exhibits no distension and no mass. There is no tenderness. There is no rebound and no guarding.  Neurological: She is alert and oriented to person, place, and time.  Skin: Skin is warm and dry. Rash (small macular rash noted in various areas of all extremities and upper chest, with notable excoriations) noted. No erythema.  Psychiatric: She has a normal mood and affect.    Results for orders placed or performed during the hospital encounter of 06/01/15 (from the past 24 hour(s))  CBC with Differential/Platelet     Status: Abnormal   Collection Time: 06/02/15 12:15 AM  Result Value Ref Range   WBC 10.5 4.0 - 10.5 K/uL   RBC 3.86 (L) 3.87 - 5.11 MIL/uL   Hemoglobin 10.4 (L) 12.0 - 15.0 g/dL   HCT 29.5 (L) 62.1 - 30.8 %   MCV 82.6 78.0 - 100.0 fL   MCH 26.9 26.0 - 34.0 pg   MCHC 32.6 30.0 - 36.0 g/dL   RDW 65.7 84.6 - 96.2 %   Platelets 276 150 - 400 K/uL   Neutrophils Relative % 66 %   Neutro Abs 7.0 1.7 - 7.7 K/uL   Lymphocytes Relative 21 %   Lymphs Abs 2.2 0.7 - 4.0 K/uL   Monocytes Relative 8 %   Monocytes Absolute 0.8 0.1 -  1.0 K/uL   Eosinophils Relative 5 %   Eosinophils Absolute 0.5 0.0 - 0.7 K/uL   Basophils Relative 0 %   Basophils Absolute 0.0 0.0 - 0.1 K/uL  Comprehensive metabolic panel     Status: Abnormal   Collection Time: 06/02/15 12:15 AM  Result Value Ref Range   Sodium 137 135 - 145 mmol/L   Potassium 3.6 3.5 - 5.1 mmol/L   Chloride 108 101 - 111 mmol/L   CO2 23 22 - 32 mmol/L   Glucose, Bld 93 65 - 99 mg/dL   BUN 16 6 - 20 mg/dL   Creatinine, Ser 8.29 0.44 - 1.00 mg/dL   Calcium 8.6 (L) 8.9 - 10.3 mg/dL   Total Protein 6.3 (L) 6.5 - 8.1 g/dL   Albumin 3.1 (L) 3.5 - 5.0 g/dL   AST 20 15 - 41 U/L   ALT 21 14 - 54 U/L   Alkaline Phosphatase 129 (H) 38 - 126 U/L   Total Bilirubin 0.5 0.3 - 1.2 mg/dL   GFR calc non Af Amer >60 >60 mL/min   GFR calc Af Amer >60 >60  mL/min   Anion gap 6 5 - 15    MAU Course  Procedures None  MDM CBC, CMP today  Assessment and Plan  A: PPD #5 Allergic dermatitis  P: Discharge home Rx for Vistaril given to patient Warning signs for worsening condition discussed Patient advised to follow-up with Dr. Gaynell Face as scheduled for PP visit or sooner if symptoms persist or worsen. Referral to Dermatology may be required if symptoms persist.  Patient may return to MAU as needed or if her condition were to change or worsen   Marny Lowenstein, PA-C  06/02/2015, 12:51 AM

## 2015-06-02 NOTE — Discharge Instructions (Signed)
Contact Dermatitis °Contact dermatitis is a rash that happens when something touches the skin. You touched something that irritates your skin, or you have allergies to something you touched. °HOME CARE  °· Avoid the thing that caused your rash. °· Keep your rash away from hot water, soap, sunlight, chemicals, and other things that might bother it. °· Do not scratch your rash. °· You can take cool baths to help stop itching. °· Only take medicine as told by your doctor. °· Keep all doctor visits as told. °GET HELP RIGHT AWAY IF:  °· Your rash is not better after 3 days. °· Your rash gets worse. °· Your rash is puffy (swollen), tender, red, sore, or warm. °· You have problems with your medicine. °MAKE SURE YOU:  °· Understand these instructions. °· Will watch your condition. °· Will get help right away if you are not doing well or get worse. °Document Released: 06/18/2009 Document Revised: 11/13/2011 Document Reviewed: 01/24/2011 °ExitCare® Patient Information ©2015 [employer]. This information is not intended to replace advice given to you by your health care provider. Make sure you discuss any questions you have with your health care provider. ° °

## 2015-07-01 ENCOUNTER — Encounter: Payer: Self-pay | Admitting: Family Medicine

## 2015-07-01 ENCOUNTER — Ambulatory Visit (INDEPENDENT_AMBULATORY_CARE_PROVIDER_SITE_OTHER): Payer: Medicaid Other | Admitting: Family Medicine

## 2015-07-01 VITALS — BP 110/62 | HR 65 | Temp 98.6°F | Wt 173.0 lb

## 2015-07-01 DIAGNOSIS — K649 Unspecified hemorrhoids: Secondary | ICD-10-CM

## 2015-07-01 MED ORDER — HYDROCORTISONE 2.5 % RE CREA: 1.0000 "application " | TOPICAL_CREAM | Freq: Two times a day (BID) | RECTAL | Status: DC

## 2015-07-01 NOTE — Progress Notes (Signed)
   Subjective:   Kim Robertson is a 34 y.o. female 5 weeks postpartum.   Here for  Chief Complaint  Patient presents with  . Hemorrhoids   #Hemorrhoids: reports they started in pregnancy and have worsened. After delivery she went 1 week without BM and then noticed the hemorrhoids. She started using miralax. She went a total of 2 weeks without a BM. She is currently using OTC steroid cream. She is using wipes. Her stools are softer after using miralax but it is very painful to have a BM. Pain is worse with BM and worse with sitting.   Review of Systems:   Per HPI. All other systems reviewed and are negative expect as in HPI   PMH, PSH, Medications, Allergies, SocialHx and FHx reviewed and updated in EMR- marked as reviewed 07/01/2015   Objective:  BP 110/62 mmHg  Pulse 65  Temp(Src) 98.6 F (37 C) (Oral)  Wt 173 lb (78.472 kg)  SpO2 99%  Gen:  34 y.o. female in NAD. Speaking in full sentences. Good eye contact HEENT: NCAT, MMM, EOMI, PERRL, anicteric sclerae.  CV: RRR, no MRG, no JVD Resp: Normal WOB GI: Soft, NTND Rectal; external hemorrhoid visualized at 6 o'clock. TTP. No fissure.  Ext: WWP MSK: Intact gait. Full ROM      Chemistry      Component Value Date/Time   NA 137 06/02/2015 0015   K 3.6 06/02/2015 0015   CL 108 06/02/2015 0015   CO2 23 06/02/2015 0015   BUN 16 06/02/2015 0015   CREATININE 0.80 06/02/2015 0015      Component Value Date/Time   CALCIUM 8.6* 06/02/2015 0015   ALKPHOS 129* 06/02/2015 0015   AST 20 06/02/2015 0015   ALT 21 06/02/2015 0015   BILITOT 0.5 06/02/2015 0015      Assessment:     Kim Robertson is a 34 y.o. female here for hemorrhoids    Plan:   # Hemorrhoids-- No fissure seen, hemorrhoids does not appear thrombosed - Rx for anusol - Discussed bowel regimen and provided instructions.  - Follow up after 2 weeks of treatment  Federico FlakeKimberly Niles Newton, MD, ABFM OB Fellow, [facility] 07/01/2015  4:48 PM

## 2015-07-01 NOTE — Patient Instructions (Signed)
You have constipation which is hard stools that are difficult to pass. It is important to have regular bowel movements every 1-3 days that are soft and easy to pass. Hard stools increase your risk of hemorrhoids and are very uncomfortable.   To prevent constipation you can increase the amount of fiber in your diet. Examples of foods with fiber are leafy greens, whole grain breads, oatmeal and other grains.  It is also important to drink at least eight 8oz glass of water everyday.   If you have not has a bowel movement in 4-5 days you made need to clean out your bowel.  This will have establish normal movement through your bowel.    Miralax Clean out  Take 8 capfuls of miralax in 64 oz of gatorade  Continue to drink at least eight 8 oz glasses of water throughout the day  You can repeat with another 8 capfuls of miralax in 64 oz of gatorade if you are not having a large amount of stools  You will need to be at home and close to a bathroom for about 8 hours when you do the above as you may need to go to the bathroom frequently.   After you are cleaned out: - Start Colace100mg  twice daily - Start Miralax once daily - Start a daily fiber supplement like metamucil or citrucel - You can safely use enemas in pregnancy and breastfeeding - if you are having diarrhea you can reduce to Colace once a day or miralax every other day or a 1/2 capful daily.   Constipation, Adult Constipation is when a person has fewer than three bowel movements a week, has difficulty having a bowel movement, or has stools that are dry, hard, or larger than normal. As people grow older, constipation is more common. A low-fiber diet, not taking in enough fluids, and taking certain medicines may make constipation worse.  CAUSES   Certain medicines, such as antidepressants, pain medicine, iron supplements, antacids, and water pills.   Certain diseases, such as diabetes, irritable bowel syndrome (IBS), thyroid disease, or  depression.   Not drinking enough water.   Not eating enough fiber-rich foods.   Stress or travel.   Lack of physical activity or exercise.   Ignoring the urge to have a bowel movement.   Using laxatives too much.  SIGNS AND SYMPTOMS   Having fewer than three bowel movements a week.   Straining to have a bowel movement.   Having stools that are hard, dry, or larger than normal.   Feeling full or bloated.   Pain in the lower abdomen.   Not feeling relief after having a bowel movement.  DIAGNOSIS  Your health care provider will take a medical history and perform a physical exam. Further testing may be done for severe constipation. Some tests may include:  A barium enema X-ray to examine your rectum, colon, and, sometimes, your small intestine.   A sigmoidoscopy to examine your lower colon.   A colonoscopy to examine your entire colon. TREATMENT  Treatment will depend on the severity of your constipation and what is causing it. Some dietary treatments include drinking more fluids and eating more fiber-rich foods. Lifestyle treatments may include regular exercise. If these diet and lifestyle recommendations do not help, your health care provider may recommend taking over-the-counter laxative medicines to help you have bowel movements. Prescription medicines may be prescribed if over-the-counter medicines do not work.  HOME CARE INSTRUCTIONS   Eat foods that have a  lot of fiber, such as fruits, vegetables, whole grains, and beans.  Limit foods high in fat and processed sugars, such as french fries, hamburgers, cookies, candies, and soda.   A fiber supplement may be added to your diet if you cannot get enough fiber from foods.   Drink enough fluids to keep your urine clear or pale yellow.   Exercise regularly or as directed by your health care provider.   Go to the restroom when you have the urge to go. Do not hold it.   Only take over-the-counter or  prescription medicines as directed by your health care provider. Do not take other medicines for constipation without talking to your health care provider first.  SEEK IMMEDIATE MEDICAL CARE IF:   You have bright red blood in your stool.   Your constipation lasts for more than 4 days or gets worse.   You have abdominal or rectal pain.   You have thin, pencil-like stools.   You have unexplained weight loss. MAKE SURE YOU:   Understand these instructions.  Will watch your condition.  Will get help right away if you are not doing well or get worse.   This information is not intended to replace advice given to you by your health care provider. Make sure you discuss any questions you have with your health care provider.   Document Released: 05/19/2004 Document Revised: 09/11/2014 Document Reviewed: 06/02/2013 Elsevier Interactive Patient Education [employer]2016 [employer]

## 2016-05-30 ENCOUNTER — Encounter: Payer: Self-pay | Admitting: *Deleted

## 2016-08-23 ENCOUNTER — Inpatient Hospital Stay (HOSPITAL_COMMUNITY)
Admission: AD | Admit: 2016-08-23 | Discharge: 2016-08-23 | Disposition: A | Discharge status: Home / Self Care | Payer: Medicaid Other | Source: Ambulatory Visit | Attending: Family Medicine | Admitting: Family Medicine

## 2016-08-23 ENCOUNTER — Encounter (HOSPITAL_COMMUNITY): Payer: Self-pay | Admitting: *Deleted

## 2016-08-23 DIAGNOSIS — R109 Unspecified abdominal pain: Secondary | ICD-10-CM | POA: Diagnosis present

## 2016-08-23 DIAGNOSIS — Z3A11 11 weeks gestation of pregnancy: Secondary | ICD-10-CM | POA: Diagnosis not present

## 2016-08-23 DIAGNOSIS — O26891 Other specified pregnancy related conditions, first trimester: Secondary | ICD-10-CM

## 2016-08-23 DIAGNOSIS — K5900 Constipation, unspecified: Secondary | ICD-10-CM

## 2016-08-23 LAB — DIFFERENTIAL
Basophils Absolute: 0 10*3/uL (ref 0.0–0.1)
Basophils Relative: 0 %
EOS ABS: 0.3 10*3/uL (ref 0.0–0.7)
EOS PCT: 3 %
LYMPHS ABS: 2.1 10*3/uL (ref 0.7–4.0)
Lymphocytes Relative: 18 %
MONOS PCT: 4 %
Monocytes Absolute: 0.4 10*3/uL (ref 0.1–1.0)
NEUTROS PCT: 75 %
Neutro Abs: 8.5 10*3/uL — ABNORMAL HIGH (ref 1.7–7.7)

## 2016-08-23 LAB — CBC
HCT: 37.7 % (ref 36.0–46.0)
HEMOGLOBIN: 12.5 g/dL (ref 12.0–15.0)
MCH: 28 pg (ref 26.0–34.0)
MCHC: 33.2 g/dL (ref 30.0–36.0)
MCV: 84.3 fL (ref 78.0–100.0)
Platelets: 236 10*3/uL (ref 150–400)
RBC: 4.47 MIL/uL (ref 3.87–5.11)
RDW: 14 % (ref 11.5–15.5)
WBC: 11.2 10*3/uL — ABNORMAL HIGH (ref 4.0–10.5)

## 2016-08-23 LAB — URINALYSIS, ROUTINE W REFLEX MICROSCOPIC
BILIRUBIN URINE: NEGATIVE
Glucose, UA: NEGATIVE mg/dL
Hgb urine dipstick: NEGATIVE
Ketones, ur: NEGATIVE mg/dL
LEUKOCYTES UA: NEGATIVE
NITRITE: NEGATIVE
Protein, ur: NEGATIVE mg/dL
SPECIFIC GRAVITY, URINE: 1.02 (ref 1.005–1.030)
pH: 6 (ref 5.0–8.0)

## 2016-08-23 LAB — WET PREP, GENITAL
CLUE CELLS WET PREP: NONE SEEN
SPERM: NONE SEEN
TRICH WET PREP: NONE SEEN
YEAST WET PREP: NONE SEEN

## 2016-08-23 LAB — HEPATITIS B SURFACE ANTIGEN: Hepatitis B Surface Ag: NEGATIVE

## 2016-08-23 MED ORDER — ACETAMINOPHEN 500 MG PO TABS
1000.0000 mg | ORAL_TABLET | Freq: Once | ORAL | Status: DC
  Filled 2016-08-23: qty 2

## 2016-08-23 MED ORDER — ACETAMINOPHEN 500 MG PO TABS: 1000.0000 mg | ORAL_TABLET | Freq: Three times a day (TID) | ORAL | 0 refills | Status: DC | PRN

## 2016-08-23 MED ORDER — FLEET ENEMA 7-19 GM/118ML RE ENEM: 1.0000 | ENEMA | Freq: Once | RECTAL | Status: DC

## 2016-08-23 NOTE — MAU Provider Note (Signed)
History     CSN: [id number]  Arrival date and time: 08/23/16 [id number]   First Provider Initiated Contact with Patient 08/23/16 1048      Chief Complaint  Patient presents with  . Abdominal Pain  . Rectal Pain   HPI   Kim Robertson is a 35 y.o. female [id number] @ 494w3d here in MAU with abdominal pain. The pain started yesterday. The pain comes and goes. She denies vaginal bleeding. She denies abnormal discharge.  She has a history of preterm delivery @ 27 weeks with first pregnancy due to PPROM. That baby is healthy.   OB History    Gravida Para Term Preterm AB Living   3 2 1 1   2    SAB TAB Ectopic Multiple Live Births         0 2      Past Medical History:  Diagnosis Date  . Preterm labor     Past Surgical History:  Procedure Laterality Date  . NO PAST SURGERIES      History reviewed. No pertinent family history.  Social History  Substance Use Topics  . Smoking status: Never Smoker  . Smokeless tobacco: Never Used  . Alcohol use No    Allergies:  Allergies  Allergen Reactions  . Neosporin [Neomycin-Bacitracin Zn-Polymyx] Rash    Prescriptions Prior to Admission  Medication Sig Dispense Refill Last Dose  . acetaminophen (TYLENOL) 500 MG tablet Take 1,000 mg by mouth every 6 (six) hours as needed for mild pain, moderate pain or headache.   Past Week at Unknown time  . docusate sodium (COLACE) 100 MG capsule Take 100 mg by mouth 2 (two) times daily.   08/22/2016 at Unknown time  . Prenatal Vit-Fe Fumarate-FA (PRENATAL MULTIVITAMIN) TABS tablet Take 1 tablet by mouth daily.   08/22/2016 at Unknown time   Results for orders placed or performed during the hospital encounter of 08/23/16 (from the past 48 hour(s))  Urinalysis, Routine w reflex microscopic     Status: None   Collection Time: 08/23/16 10:22 AM  Result Value Ref Range   Color, Urine YELLOW YELLOW   APPearance CLEAR CLEAR   Specific Gravity, Urine 1.020 1.005 - 1.030   pH 6.0 5.0 - 8.0   Glucose, UA NEGATIVE NEGATIVE mg/dL   Hgb urine dipstick NEGATIVE NEGATIVE   Bilirubin Urine NEGATIVE NEGATIVE   Ketones, ur NEGATIVE NEGATIVE mg/dL   Protein, ur NEGATIVE NEGATIVE mg/dL   Nitrite NEGATIVE NEGATIVE   Leukocytes, UA NEGATIVE NEGATIVE  Wet prep, genital     Status: Abnormal   Collection Time: 08/23/16 10:50 AM  Result Value Ref Range   Yeast Wet Prep HPF POC NONE SEEN NONE SEEN   Trich, Wet Prep NONE SEEN NONE SEEN   Clue Cells Wet Prep HPF POC NONE SEEN NONE SEEN   WBC, Wet Prep HPF POC FEW (A) NONE SEEN    Comment: FEW BACTERIA SEEN   Sperm NONE SEEN   CBC     Status: Abnormal   Collection Time: 08/23/16 11:17 AM  Result Value Ref Range   WBC 11.2 (H) 4.0 - 10.5 K/uL   RBC 4.47 3.87 - 5.11 MIL/uL   Hemoglobin 12.5 12.0 - 15.0 g/dL   HCT 21.337.7 08.636.0 - 57.846.0 %   MCV 84.3 78.0 - 100.0 fL   MCH 28.0 26.0 - 34.0 pg   MCHC 33.2 30.0 - 36.0 g/dL   RDW 46.914.0 62.911.5 - 52.815.5 %   Platelets 236 150 - 400 K/uL  Differential     Status: Abnormal   Collection Time: 08/23/16 11:17 AM  Result Value Ref Range   Neutrophils Relative % 75 %   Neutro Abs 8.5 (H) 1.7 - 7.7 K/uL   Lymphocytes Relative 18 %   Lymphs Abs 2.1 0.7 - 4.0 K/uL   Monocytes Relative 4 %   Monocytes Absolute 0.4 0.1 - 1.0 K/uL   Eosinophils Relative 3 %   Eosinophils Absolute 0.3 0.0 - 0.7 K/uL   Basophils Relative 0 %   Basophils Absolute 0.0 0.0 - 0.1 K/uL    Review of Systems  Constitutional: Negative for chills and fever.  Gastrointestinal: Positive for constipation.   Physical Exam   Blood pressure (!) 114/54, pulse 77, temperature 98.8 F (37.1 C), temperature source Oral, resp. rate 16, weight 189 lb 6.4 oz (85.9 kg), last menstrual period 06/04/2016, unknown if currently breastfeeding.  Physical Exam  Constitutional: She is oriented to person, place, and time. She appears well-developed and well-nourished. No distress.  HENT:  Head: Normocephalic.  Eyes: Pupils are equal, round, and reactive  to light.  Respiratory: Effort normal.  GI: Soft. She exhibits no distension. There is no tenderness. There is no rebound.  Genitourinary:  Genitourinary Comments: Cervix closed, posterior   Musculoskeletal: Normal range of motion.  Neurological: She is alert and oriented to person, place, and time.  Skin: Skin is warm. She is not diaphoretic.  Psychiatric: Her behavior is normal.    MAU Course  Procedures  None  MDM  + fetal heart tones.   Assessment and Plan   A:  1. Abdominal pain in pregnancy, first trimester   2. Constipation, unspecified constipation type     P:  Discharge home in stable condition  Discussed at home treatment for constipation.  Increase PO fluids Start prenatal care Return to MAU if symptoms worsen    Duane LopeJennifer I Rasch, NP  08/23/2016'3:29 PM

## 2016-08-23 NOTE — Discharge Instructions (Signed)
Abdominal Pain During Pregnancy Belly (abdominal) pain is common during pregnancy. Most of the time, it is not a serious problem. Other times, it can be a sign that something is wrong with the pregnancy. Always tell your doctor if you have belly pain. Follow these instructions at home: Monitor your belly pain for any changes. The following actions may help you feel better:  Do not have sex (intercourse) or put anything in your vagina until you feel better.  Rest until your pain stops.  Drink clear fluids if you feel sick to your stomach (nauseous). Do not eat solid food until you feel better.  Only take medicine as told by your doctor.  Keep all doctor visits as told. Get help right away if:  You are bleeding, leaking fluid, or pieces of tissue come out of your vagina.  You have more pain or cramping.  You keep throwing up (vomiting).  You have pain when you pee (urinate) or have blood in your pee.  You have a fever.  You do not feel your baby moving as much.  You feel very weak or feel like passing out.  You have trouble breathing, with or without belly pain.  You have a very bad headache and belly pain.  You have fluid leaking from your vagina and belly pain.  You keep having watery poop (diarrhea).  Your belly pain does not go away after resting, or the pain gets worse. This information is not intended to replace advice given to you by your health care provider. Make sure you discuss any questions you have with your health care provider. Document Released: 08/09/2009 Document Revised: 03/29/2016 Document Reviewed: 03/20/2013 Elsevier Interactive Patient Education  2017 [employer]  Constipation, Adult Constipation is when a person:  Poops (has a bowel movement) fewer times in a week than normal.  Has a hard time pooping.  Has poop that is dry, hard, or bigger than normal. Follow these instructions at home: Eating and drinking  Eat foods that have a lot of  fiber, such as:  Fresh fruits and vegetables.  Whole grains.  Beans.  Eat less of foods that are high in fat, low in fiber, or overly processed, such as:  [place] fries.  Hamburgers.  Cookies.  Candy.  Soda.  Drink enough fluid to keep your pee (urine) clear or pale yellow. General instructions  Exercise regularly or as told by your doctor.  Go to the restroom when you feel like you need to poop. Do not hold it in.  Take over-the-counter and prescription medicines only as told by your doctor. These include any fiber supplements.  Do pelvic floor retraining exercises, such as:  Doing deep breathing while relaxing your lower belly (abdomen).  Relaxing your pelvic floor while pooping.  Watch your condition for any changes.  Keep all follow-up visits as told by your doctor. This is important. Contact a doctor if:  You have pain that gets worse.  You have a fever.  You have not pooped for 4 days.  You throw up (vomit).  You are not hungry.  You lose weight.  You are bleeding from the anus.  You have thin, pencil-like poop (stool). Get help right away if:  You have a fever, and your symptoms suddenly get worse.  You leak poop or have blood in your poop.  Your belly feels hard or bigger than normal (is bloated).  You have very bad belly pain.  You feel dizzy or you faint. This information  is not intended to replace advice given to you by your health care provider. Make sure you discuss any questions you have with your health care provider. Document Released: 02/07/2008 Document Revised: 03/10/2016 Document Reviewed: 02/09/2016 Elsevier Interactive Patient Education  2017 [employer]

## 2016-08-23 NOTE — MAU Note (Signed)
Pain in lower abd started yesterday, cramping.  Also having hemorrhoid pain.no bleeding, or leaking. preg confirmed at health dept

## 2016-08-24 LAB — GC/CHLAMYDIA PROBE AMP (~~LOC~~) NOT AT ARMC
Chlamydia: NEGATIVE
Neisseria Gonorrhea: NEGATIVE

## 2016-08-24 LAB — RUBELLA SCREEN: Rubella: 13.4 index (ref >0.99)

## 2016-08-24 LAB — RPR: RPR: NONREACTIVE

## 2016-08-24 LAB — HIV ANTIBODY (ROUTINE TESTING W REFLEX): HIV Screen 4th Generation wRfx: NONREACTIVE

## 2016-09-04 NOTE — L&D Delivery Note (Signed)
Patient is 36 y.o. [id number] 2051w0d admitted for SOL, hx of GDM A1.   Delivery Note At 9:20 PM a healthy female was delivered via Vaginal, Spontaneous Delivery (Presentation: occiput; anterior).  APGAR: 9, 9; weight pending.   Placenta status: intact.  Cord: 3V with the following complications: none.  Cord pH: not sent.  Anesthesia:  Fentanyl Episiotomy: None Lacerations: 2nd degree;Perineal Suture Repair: 2.0 vicryl Est. Blood Loss (mL):  250  Mom to postpartum.  Baby to Couplet care / Skin to Skin.  Upon arrival patient was complete and pushing. She pushed with good maternal effort to deliver a healthy baby boy. Baby delivered without difficulty, was noted to have good tone and place on maternal abdomen for oral suctioning, drying and stimulation. Delayed cord clamping performed. Placenta delivered intact with 3V cord. Vaginal canal and perineum was inspected and repaired; hemostatic. Pitocin was started and uterus massaged until bleeding slowed. Counts of sharps, instruments, and lap pads were all correct.   Tarri AbernethyAbigail J Lancaster, MD 01/25/2017, 9:48 PM  The above was performed under my direct supervision and guidance.

## 2016-09-19 ENCOUNTER — Encounter (HOSPITAL_COMMUNITY): Payer: Self-pay | Admitting: *Deleted

## 2016-09-19 ENCOUNTER — Inpatient Hospital Stay (HOSPITAL_COMMUNITY)
Admission: AD | Admit: 2016-09-19 | Discharge: 2016-09-19 | Disposition: A | Discharge status: Home / Self Care | Payer: Medicaid Other | Source: Ambulatory Visit | Attending: Obstetrics and Gynecology | Admitting: Obstetrics and Gynecology

## 2016-09-19 DIAGNOSIS — O99512 Diseases of the respiratory system complicating pregnancy, second trimester: Secondary | ICD-10-CM | POA: Insufficient documentation

## 2016-09-19 DIAGNOSIS — Z3A15 15 weeks gestation of pregnancy: Secondary | ICD-10-CM | POA: Diagnosis not present

## 2016-09-19 DIAGNOSIS — R1084 Generalized abdominal pain: Secondary | ICD-10-CM

## 2016-09-19 DIAGNOSIS — J020 Streptococcal pharyngitis: Secondary | ICD-10-CM | POA: Diagnosis not present

## 2016-09-19 DIAGNOSIS — R1030 Lower abdominal pain, unspecified: Secondary | ICD-10-CM | POA: Diagnosis present

## 2016-09-19 LAB — CBC
HEMATOCRIT: 35.4 % — AB (ref 36.0–46.0)
HEMOGLOBIN: 11.7 g/dL — AB (ref 12.0–15.0)
MCH: 27.5 pg (ref 26.0–34.0)
MCHC: 33.1 g/dL (ref 30.0–36.0)
MCV: 83.3 fL (ref 78.0–100.0)
Platelets: 209 10*3/uL (ref 150–400)
RBC: 4.25 MIL/uL (ref 3.87–5.11)
RDW: 13.8 % (ref 11.5–15.5)
WBC: 16.1 10*3/uL — ABNORMAL HIGH (ref 4.0–10.5)

## 2016-09-19 LAB — URINALYSIS, ROUTINE W REFLEX MICROSCOPIC
Bilirubin Urine: NEGATIVE
GLUCOSE, UA: NEGATIVE mg/dL
Hgb urine dipstick: NEGATIVE
Ketones, ur: NEGATIVE mg/dL
NITRITE: NEGATIVE
PROTEIN: NEGATIVE mg/dL
SPECIFIC GRAVITY, URINE: 1.019 (ref 1.005–1.030)
pH: 7 (ref 5.0–8.0)

## 2016-09-19 LAB — COMPREHENSIVE METABOLIC PANEL
ALBUMIN: 3.4 g/dL — AB (ref 3.5–5.0)
ALK PHOS: 64 U/L (ref 38–126)
ALT: 11 U/L — AB (ref 14–54)
AST: 15 U/L (ref 15–41)
Anion gap: 7 (ref 5–15)
BILIRUBIN TOTAL: 0.3 mg/dL (ref 0.3–1.2)
BUN: 7 mg/dL (ref 6–20)
CALCIUM: 8.9 mg/dL (ref 8.9–10.3)
CO2: 22 mmol/L (ref 22–32)
CREATININE: 0.57 mg/dL (ref 0.44–1.00)
Chloride: 103 mmol/L (ref 101–111)
GFR calc Af Amer: >60 mL/min (ref >60)
GFR calc non Af Amer: >60 mL/min (ref >60)
GLUCOSE: 89 mg/dL (ref 65–99)
POTASSIUM: 3.9 mmol/L (ref 3.5–5.1)
Sodium: 132 mmol/L — ABNORMAL LOW (ref 135–145)
TOTAL PROTEIN: 6.6 g/dL (ref 6.5–8.1)

## 2016-09-19 LAB — DIFFERENTIAL
BASOS ABS: 0 10*3/uL (ref 0.0–0.1)
Basophils Relative: 0 %
Eosinophils Absolute: 0.3 10*3/uL (ref 0.0–0.7)
Eosinophils Relative: 2 %
LYMPHS ABS: 1.7 10*3/uL (ref 0.7–4.0)
LYMPHS PCT: 10 %
MONO ABS: 0.9 10*3/uL (ref 0.1–1.0)
MONOS PCT: 6 %
NEUTROS ABS: 13.3 10*3/uL — AB (ref 1.7–7.7)
Neutrophils Relative %: 82 %

## 2016-09-19 LAB — RAPID STREP SCREEN (MED CTR MEBANE ONLY): Streptococcus, Group A Screen (Direct): POSITIVE — AB

## 2016-09-19 MED ORDER — DICYCLOMINE HCL 10 MG PO CAPS
10.0000 mg | ORAL_CAPSULE | Freq: Once | ORAL | Status: AC
  Administered 2016-09-19: 10 mg via ORAL
  Filled 2016-09-19: qty 1

## 2016-09-19 MED ORDER — SALINE SPRAY 0.65 % NA SOLN
1.0000 | NASAL | Status: DC | PRN
  Administered 2016-09-19: 1 via NASAL
  Filled 2016-09-19: qty 44

## 2016-09-19 MED ORDER — SIMETHICONE 80 MG PO CHEW: 80.0000 mg | CHEWABLE_TABLET | Freq: Four times a day (QID) | ORAL | 0 refills | Status: DC | PRN

## 2016-09-19 MED ORDER — PROMETHAZINE HCL 25 MG PO TABS: 25.0000 mg | ORAL_TABLET | Freq: Four times a day (QID) | ORAL | 2 refills | Status: DC | PRN

## 2016-09-19 MED ORDER — PENICILLIN V POTASSIUM 500 MG PO TABS: 500.0000 mg | ORAL_TABLET | Freq: Two times a day (BID) | ORAL | 0 refills | Status: DC

## 2016-09-19 MED ORDER — SIMETHICONE 80 MG PO CHEW
80.0000 mg | CHEWABLE_TABLET | Freq: Once | ORAL | Status: AC
  Administered 2016-09-19: 80 mg via ORAL
  Filled 2016-09-19: qty 1

## 2016-09-19 MED ORDER — ACETAMINOPHEN 325 MG PO TABS
650.0000 mg | ORAL_TABLET | Freq: Once | ORAL | Status: AC
  Administered 2016-09-19: 650 mg via ORAL
  Filled 2016-09-19: qty 2

## 2016-09-19 NOTE — MAU Provider Note (Signed)
Chief Complaint:  Abdominal Pain; Headache; and Sore Throat   First Provider Initiated Contact with Patient 09/19/16 1038     HPI: Kim Robertson is a 36 y.o. G3P1102 at 70w2dwho presents to maternity admissions reporting lower abdominal pain and URI symptoms such as sore throat and headache. Most of these symptoms started yesterday. Eating does not make pain better or worse. No drop in appetite.. She denies LOF, vaginal bleeding, vaginal itching/burning, urinary symptoms, dizziness, n/v, diarrhea, constipation or fever/chills.   Abdominal Pain  This is a new problem. The current episode started yesterday. The onset quality is gradual. The problem occurs intermittently. The problem has been unchanged. The pain is located in the LLQ, RLQ and periumbilical region. The pain is moderate. The quality of the pain is cramping. The abdominal pain does not radiate. Associated symptoms include headaches. Pertinent negatives include no anorexia, constipation, diarrhea, dysuria, fever, myalgias, nausea or vomiting. The pain is aggravated by palpation. The pain is relieved by nothing. She has tried nothing for the symptoms.  Headache   This is a new problem. The current episode started yesterday. The problem has been unchanged. The pain is located in the bilateral and frontal region. The pain does not radiate. The quality of the pain is described as aching. Associated symptoms include abdominal pain and sinus pressure. Pertinent negatives include no anorexia, blurred vision, coughing, dizziness, fever, nausea, neck pain, numbness, photophobia, tingling, visual change, vomiting or weakness. Nothing aggravates the symptoms. She has tried nothing for the symptoms.  Sore Throat   This is a new problem. The current episode started yesterday. The problem has been unchanged. There has been no fever. The pain is mild. Associated symptoms include abdominal pain and headaches. Pertinent negatives include no coughing, diarrhea,  neck pain, shortness of breath or vomiting.   RN Note: Pt states she has been having a headache and sore throat since last night.  Pt reports she has been having lower abdominal cramping that has been going on since yesterday morning.    Electronically signed by Amador Cunas, RN     Past Medical History: Past Medical History:  Diagnosis Date  . Preterm labor     Past obstetric history: OB History  Gravida Para Term Preterm AB Living  3 2 1 1   2   SAB TAB Ectopic Multiple Live Births        0 2    # Outcome Date GA Lbr Len/2nd Weight Sex Delivery Anes PTL Lv  3 Current           2 Term 05/27/15 [redacted]w[redacted]d 07:43 / 00:28 8 lb 10.3 oz (3.92 kg) M Vag-Spont Local  LIV     Birth Comments: NONE  1 Preterm 12/14/12 [redacted]w[redacted]d 46:39 / 02:04 2 lb 4.7 oz (1.04 kg) M Vag-Spont None  LIV      Past Surgical History: Past Surgical History:  Procedure Laterality Date  . NO PAST SURGERIES      Family History: History reviewed. No pertinent family history.  Social History: Social History  Substance Use Topics  . Smoking status: Never Smoker  . Smokeless tobacco: Never Used  . Alcohol use No    Allergies:  Allergies  Allergen Reactions  . Neosporin [Neomycin-Bacitracin Zn-Polymyx] Rash    Meds:  Prescriptions Prior to Admission  Medication Sig Dispense Refill Last Dose  . acetaminophen (TYLENOL) 500 MG tablet Take 2 tablets (1,000 mg total) by mouth every 8 (eight) hours as needed for mild pain, moderate pain  or headache. 30 tablet 0 09/18/2016 at Unknown time  . docusate sodium (COLACE) 100 MG capsule Take 100 mg by mouth 2 (two) times daily.   Past Week at Unknown time  . polyethylene glycol (MIRALAX / GLYCOLAX) packet Take 17 g by mouth daily.   09/18/2016 at Unknown time  . Prenatal Vit-Fe Fumarate-FA (PRENATAL MULTIVITAMIN) TABS tablet Take 1 tablet by mouth daily.   09/18/2016 at Unknown time    I have reviewed patient's Past Medical Hx, Surgical Hx, Family Hx, Social Hx,  medications and allergies.   ROS:  Review of Systems  Constitutional: Negative for fever.  HENT: Positive for sinus pressure.   Eyes: Negative for blurred vision and photophobia.  Respiratory: Negative for cough and shortness of breath.   Gastrointestinal: Positive for abdominal pain. Negative for anorexia, constipation, diarrhea, nausea and vomiting.  Genitourinary: Negative for dysuria.  Musculoskeletal: Negative for myalgias and neck pain.  Neurological: Positive for headaches. Negative for dizziness, tingling, weakness and numbness.   Other systems negative  Physical Exam  Patient Vitals for the past 24 hrs:  BP Temp Temp src Pulse Resp SpO2 Height Weight  09/19/16 1031 114/59 97.8 F (36.6 C) Oral 90 18 100 % - -  09/19/16 1023 - - - - - - 5\' 3"  (1.6 m) 196 lb 4 oz (89 kg)   Constitutional: Well-developed, well-nourished female in no acute distress.  HEENT:  Throat slightly erethematous, no lymphadenopathy Cardiovascular: normal rate and rhythm Respiratory: normal effort, clear to auscultation bilaterally GI: Abd soft, non-tender, gravid appropriate for gestational age.   No rebound or guarding. MS: Extremities nontender, no edema, normal ROM Neurologic: Alert and oriented x 4.  GU: Neg CVAT.  PELVIC EXAM: Cervix firm, posterior, neg CMT, uterus nontender, Fundal Height consistent with dates, adnexa without tenderness, enlargement, or mass  Dilation: Closed Effacement (%): Thick Exam by:: Artelia Laroche, CNM     Labs: Results for orders placed or performed during the hospital encounter of 09/19/16 (from the past 24 hour(s))  Urinalysis, Routine w reflex microscopic     Status: Abnormal   Collection Time: 09/19/16 10:20 AM  Result Value Ref Range   Color, Urine YELLOW YELLOW   APPearance CLEAR CLEAR   Specific Gravity, Urine 1.019 1.005 - 1.030   pH 7.0 5.0 - 8.0   Glucose, UA NEGATIVE NEGATIVE mg/dL   Hgb urine dipstick NEGATIVE NEGATIVE   Bilirubin Urine NEGATIVE  NEGATIVE   Ketones, ur NEGATIVE NEGATIVE mg/dL   Protein, ur NEGATIVE NEGATIVE mg/dL   Nitrite NEGATIVE NEGATIVE   Leukocytes, UA SMALL (A) NEGATIVE   RBC / HPF 0-5 0 - 5 RBC/hpf   WBC, UA 0-5 0 - 5 WBC/hpf   Bacteria, UA FEW (A) NONE SEEN   Squamous Epithelial / LPF 0-5 (A) NONE SEEN   Mucous PRESENT   Rapid strep screen (not at [facility])     Status: Abnormal   Collection Time: 09/19/16 10:43 AM  Result Value Ref Range   Streptococcus, Group A Screen (Direct) POSITIVE (A) NEGATIVE  CBC     Status: Abnormal   Collection Time: 09/19/16 10:49 AM  Result Value Ref Range   WBC 16.1 (H) 4.0 - 10.5 K/uL   RBC 4.25 3.87 - 5.11 MIL/uL   Hemoglobin 11.7 (L) 12.0 - 15.0 g/dL   HCT 09.8 (L) 11.9 - 14.7 %   MCV 83.3 78.0 - 100.0 fL   MCH 27.5 26.0 - 34.0 pg   MCHC 33.1 30.0 - 36.0 g/dL  RDW 13.8 11.5 - 15.5 %   Platelets 209 150 - 400 K/uL  Differential     Status: Abnormal   Collection Time: 09/19/16 10:49 AM  Result Value Ref Range   Neutrophils Relative % 82 %   Neutro Abs 13.3 (H) 1.7 - 7.7 K/uL   Lymphocytes Relative 10 %   Lymphs Abs 1.7 0.7 - 4.0 K/uL   Monocytes Relative 6 %   Monocytes Absolute 0.9 0.1 - 1.0 K/uL   Eosinophils Relative 2 %   Eosinophils Absolute 0.3 0.0 - 0.7 K/uL   Basophils Relative 0 %   Basophils Absolute 0.0 0.0 - 0.1 K/uL  Comprehensive metabolic panel     Status: Abnormal   Collection Time: 09/19/16 10:49 AM  Result Value Ref Range   Sodium 132 (L) 135 - 145 mmol/L   Potassium 3.9 3.5 - 5.1 mmol/L   Chloride 103 101 - 111 mmol/L   CO2 22 22 - 32 mmol/L   Glucose, Bld 89 65 - 99 mg/dL   BUN 7 6 - 20 mg/dL   Creatinine, Ser 1.610.57 0.44 - 1.00 mg/dL   Calcium 8.9 8.9 - 09.610.3 mg/dL   Total Protein 6.6 6.5 - 8.1 g/dL   Albumin 3.4 (L) 3.5 - 5.0 g/dL   AST 15 15 - 41 U/L   ALT 11 (L) 14 - 54 U/L   Alkaline Phosphatase 64 38 - 126 U/L   Total Bilirubin 0.3 0.3 - 1.2 mg/dL   GFR calc non Af Amer >60 >60 mL/min   GFR calc Af Amer >60 >60 mL/min   Anion  gap 7 5 - 15    Imaging:  No results found.  MAU Course/MDM: I have ordered labs and reviewed results. Increased WBC likely due to strep.  + group A strep Gave her Bentyl and Simethicone which provided some relief Will treat the strep and use supportive care States husband had sore throat last week and got antibiotics  Assessment: [facility] at 6972w2d Strep throat Abdominal cramping with no evidence of PTL.  May be related to strep.  Plan: Discharge home Rx Penicillin bid x 10 days.  Rx Simethicone for bloating Rx Phenergan for nausea Preterm Labor precautions and fetal kick counts Follow up in Office for prenatal visits and recheck of status [facility]  Encouraged to return here or to other Urgent Care/ED if she develops worsening of symptoms, increase in pain, fever, or other concerning symptoms.   Pt stable at time of discharge.  Wynelle BourgeoisMarie Williams CNM, MSN Certified Nurse-Midwife 09/19/2016 11:40 AM

## 2016-09-19 NOTE — Discharge Instructions (Signed)

## 2016-09-19 NOTE — MAU Note (Signed)
Pt states she has been having a headache and sore throat since last night.  Pt reports she has been having lower abdominal cramping that has been going on since yesterday morning.

## 2016-09-20 LAB — CULTURE, GROUP A STREP (THRC)

## 2016-10-02 ENCOUNTER — Encounter: Payer: Self-pay | Admitting: Obstetrics and Gynecology

## 2016-10-02 ENCOUNTER — Ambulatory Visit (INDEPENDENT_AMBULATORY_CARE_PROVIDER_SITE_OTHER): Payer: Medicaid Other | Admitting: Obstetrics and Gynecology

## 2016-10-02 ENCOUNTER — Other Ambulatory Visit (HOSPITAL_COMMUNITY)
Admission: RE | Admit: 2016-10-02 | Discharge: 2016-10-02 | Disposition: A | Discharge status: Home / Self Care | Payer: Medicaid Other | Source: Ambulatory Visit | Attending: Obstetrics and Gynecology | Admitting: Obstetrics and Gynecology

## 2016-10-02 VITALS — BP 102/67 | HR 78 | Temp 97.0°F | Wt 194.2 lb

## 2016-10-02 DIAGNOSIS — Z113 Encounter for screening for infections with a predominantly sexual mode of transmission: Secondary | ICD-10-CM | POA: Diagnosis present

## 2016-10-02 DIAGNOSIS — O0992 Supervision of high risk pregnancy, unspecified, second trimester: Secondary | ICD-10-CM

## 2016-10-02 DIAGNOSIS — O09892 Supervision of other high risk pregnancies, second trimester: Secondary | ICD-10-CM

## 2016-10-02 DIAGNOSIS — Z1151 Encounter for screening for human papillomavirus (HPV): Secondary | ICD-10-CM | POA: Diagnosis not present

## 2016-10-02 DIAGNOSIS — Z01419 Encounter for gynecological examination (general) (routine) without abnormal findings: Secondary | ICD-10-CM | POA: Diagnosis present

## 2016-10-02 DIAGNOSIS — O0990 Supervision of high risk pregnancy, unspecified, unspecified trimester: Secondary | ICD-10-CM | POA: Insufficient documentation

## 2016-10-02 DIAGNOSIS — O09212 Supervision of pregnancy with history of pre-term labor, second trimester: Secondary | ICD-10-CM

## 2016-10-02 NOTE — Progress Notes (Signed)
Subjective:  Kim Robertson is a 36 y.o. G3P1102 at 2333w1d being seen today for her first OB visit. She has no complaints this morning. Completing treatment for sore throat. No chronic medical problems. H/O PTL/PTD in April 2014 d/t PROM. TSVD in 2016 without problems. Uncertain LMP with this pregnancy. She is currently monitored for the following issues for this high-risk pregnancy and has History of preterm delivery, currently pregnant in second trimester and Supervision of high-risk pregnancy on her problem list.  Patient reports no complaints.  Contractions: Not present. Vag. Bleeding: None.  Movement: Present. Denies leaking of fluid.   The following portions of the patient's history were reviewed and updated as appropriate: allergies, current medications, past family history, past medical history, past social history, past surgical history and problem list. Problem list updated.  Objective:   Vitals:   10/02/16 0834  BP: 102/67  Pulse: 78  Temp: 97 F (36.1 C)  Weight: 194 lb 3.2 oz (88.1 kg)    Fetal Status: Fetal Heart Rate (bpm): 148   Movement: Present     General:  Alert, oriented and cooperative. Patient is in no acute distress.  Skin: Skin is warm and dry. No rash noted.   Cardiovascular: Normal heart rate noted  Respiratory: Normal respiratory effort, no problems with respiration noted  Abdomen: Soft, gravid, appropriate for gestational age. Pain/Pressure: Present     Pelvic:  Cervical exam performed        Extremities: Normal range of motion.  Edema: None  Mental Status: Normal mood and affect. Normal behavior. Normal judgment and thought content.  Breast sym supple no nipple d/c masses or adenopathy   Urinalysis:      Assessment and Plan:  Pregnancy: G3P1102 at 7833w1d  1. Supervision of high risk pregnancy in second trimester Prenatal labs and care reviewed with pt. Declines Flu vaccine Will check U/S to verify dates Information on Quad screen provided to pt.  Obtain at next OB visit if pt desires and once dating completed. - Obstetric Panel, Including HIV - Varicella zoster antibody, IgG - ToxASSURE Select 13 (MW), Urine - Culture, OB Urine - Hemoglobinopathy evaluation - US MFM OB COMP + 14 WK; Future - GC/Chlamydia Probe Amp - Cytology - PAP  2. History of preterm delivery, currently pregnant in second trimester As noted above d/t PROM  Preterm labor symptoms and general obstetric precautions including but not limited to vaginal bleeding, contractions, leaking of fluid and fetal movement were reviewed in detail with the patient. Please refer to After Visit Summary for other counseling recommendations.  Return in about 3 weeks (around 10/23/2016) for OB visit.   Hermina StaggersMichael L Ronte Parker, MD

## 2016-10-02 NOTE — Patient Instructions (Signed)
Alpha-Fetoprotein Test Why am I having this test? This test is used to screen for birth defects, such as chromosomal abnormalities, neural tube defects, and body wall defects. It can also be used as a tumor marker for certain cancers. Alpha-Fetoprotein (AFP) is a protein that is made by the fetal liver. Levels can be detected during pregnancy, starting at [redacted] weeks gestation and peaking at 16-[redacted] weeks gestation. Your health care provider may perform this test if you are pregnant or if a tumor is suspected. What kind of sample is taken? A blood sample is required for this test. It is usually collected by inserting a needle into a vein. How do I prepare for this test? There is no preparation or fasting required for this test. What are the reference ranges? References ranges are considered healthy ranges established after testing a large group of healthy people. Reference ranges may vary among different people, labs, and hospitals. It is your responsibility to obtain your test results. Ask the lab or department performing the test when and how you will get your results. Reference ranges for AFP are the following:  Adult: Less than 40 ng/mL or less than 40 mcg/L (SI units).  Child younger than 1 year: Less than 30 ng/mL. Ranges are stratified by weeks of gestation, and they vary among laboratories. What do the results mean? Values above the reference ranges in pregnant women may indicate:  Neural tube defects.  Abdominal wall defects.  Multiple pregnancy.  Congenital abnormalities.  Fetal distress or fetal death. Values above the reference ranges in nonpregnant women may indicate:  Reproductive cancers.  Liver cancer.  Liver cell death.  Other types of cancer. Values below the reference ranges in pregnant women may indicate:  Down syndrome.  Fetal death. Talk with your health care provider to discuss your results, treatment options, and if necessary, the need for more tests. Talk  with your health care provider if you have any questions about your results. Talk with your health care provider to discuss your results, treatment options, and if necessary, the need for more tests. Talk with your health care provider if you have any questions about your results. This information is not intended to replace advice given to you by your health care provider. Make sure you discuss any questions you have with your health care provider. Document Released: 09/14/2004 Document Revised: 04/24/2016 Document Reviewed: 01/23/2014 Elsevier Interactive Patient Education  2017 [employer] Second Trimester of Pregnancy The second trimester is from week 13 through week 28 (months 4 through 6). The second trimester is often a time when you feel your best. Your body has also adjusted to being pregnant, and you begin to feel better physically. Usually, morning sickness has lessened or quit completely, you may have more energy, and you may have an increase in appetite. The second trimester is also a time when the fetus is growing rapidly. At the end of the sixth month, the fetus is about 9 inches long and weighs about 1 pounds. You will likely begin to feel the baby move (quickening) between 18 and 20 weeks of the pregnancy. Body changes during your second trimester Your body continues to go through many changes during your second trimester. The changes vary from woman to woman.  Your weight will continue to increase. You will notice your lower abdomen bulging out.  You may begin to get stretch marks on your hips, abdomen, and breasts.  You may develop headaches that can be relieved by medicines. The medicines  should be approved by your health care provider.  You may urinate more often because the fetus is pressing on your bladder.  You may develop or continue to have heartburn as a result of your pregnancy.  You may develop constipation because certain hormones are causing the muscles that push  waste through your intestines to slow down.  You may develop hemorrhoids or swollen, bulging veins (varicose veins).  You may have back pain. This is caused by:  Weight gain.  Pregnancy hormones that are relaxing the joints in your pelvis.  A shift in weight and the muscles that support your balance.  Your breasts will continue to grow and they will continue to become tender.  Your gums may bleed and may be sensitive to brushing and flossing.  Dark spots or blotches (chloasma, mask of pregnancy) may develop on your face. This will likely fade after the baby is born.  A dark line from your belly button to the pubic area (linea nigra) may appear. This will likely fade after the baby is born.  You may have changes in your hair. These can include thickening of your hair, rapid growth, and changes in texture. Some women also have hair loss during or after pregnancy, or hair that feels dry or thin. Your hair will most likely return to normal after your baby is born. What to expect at prenatal visits During a routine prenatal visit:  You will be weighed to make sure you and the fetus are growing normally.  Your blood pressure will be taken.  Your abdomen will be measured to track your baby's growth.  The fetal heartbeat will be listened to.  Any test results from the previous visit will be discussed. Your health care provider may ask you:  How you are feeling.  If you are feeling the baby move.  If you have had any abnormal symptoms, such as leaking fluid, bleeding, severe headaches, or abdominal cramping.  If you are using any tobacco products, including cigarettes, chewing tobacco, and electronic cigarettes.  If you have any questions. Other tests that may be performed during your second trimester include:  Blood tests that check for:  Low iron levels (anemia).  Gestational diabetes (between 24 and 28 weeks).  Rh antibodies. This is to check for a protein on red blood  cells (Rh factor).  Urine tests to check for infections, diabetes, or protein in the urine.  An ultrasound to confirm the proper growth and development of the baby.  An amniocentesis to check for possible genetic problems.  Fetal screens for spina bifida and Down syndrome.  HIV (human immunodeficiency virus) testing. Routine prenatal testing includes screening for HIV, unless you choose not to have this test. Follow these instructions at home: Eating and drinking  Continue to eat regular, healthy meals.  Avoid raw meat, uncooked cheese, cat litter boxes, and soil used by cats. These carry germs that can cause birth defects in the baby.  Take your prenatal vitamins.  Take 1500-2000 mg of calcium daily starting at the 20th week of pregnancy until you deliver your baby.  If you develop constipation:  Take over-the-counter or prescription medicines.  Drink enough fluid to keep your urine clear or pale yellow.  Eat foods that are high in fiber, such as fresh fruits and vegetables, whole grains, and beans.  Limit foods that are high in fat and processed sugars, such as fried and sweet foods. Activity  Exercise only as directed by your health care provider. Experiencing  uterine cramps is a good sign to stop exercising.  Avoid heavy lifting, wear low heel shoes, and practice good posture.  Wear your seat belt at all times when driving.  Rest with your legs elevated if you have leg cramps or low back pain.  Wear a good support bra for breast tenderness.  Do not use hot tubs, steam rooms, or saunas. Lifestyle  Avoid all smoking, herbs, alcohol, and unprescribed drugs. These chemicals affect the formation and growth of the baby.  Do not use any products that contain nicotine or tobacco, such as cigarettes and e-cigarettes. If you need help quitting, ask your health care provider.  A sexual relationship may be continued unless your health care provider directs you  otherwise. General instructions  Follow your health care provider's instructions regarding medicine use. There are medicines that are either safe or unsafe to take during pregnancy.  Take warm sitz baths to soothe any pain or discomfort caused by hemorrhoids. Use hemorrhoid cream if your health care provider approves.  If you develop varicose veins, wear support hose. Elevate your feet for 15 minutes, 3-4 times a day. Limit salt in your diet.  Visit your dentist if you have not gone yet during your pregnancy. Use a soft toothbrush to brush your teeth and be gentle when you floss.  Keep all follow-up prenatal visits as told by your health care provider. This is important. Contact a health care provider if:  You have dizziness.  You have mild pelvic cramps, pelvic pressure, or nagging pain in the abdominal area.  You have persistent nausea, vomiting, or diarrhea.  You have a bad smelling vaginal discharge.  You have pain with urination. Get help right away if:  You have a fever.  You are leaking fluid from your vagina.  You have spotting or bleeding from your vagina.  You have severe abdominal cramping or pain.  You have rapid weight gain or weight loss.  You have shortness of breath with chest pain.  You notice sudden or extreme swelling of your face, hands, ankles, feet, or legs.  You have not felt your baby move in over an hour.  You have severe headaches that do not go away with medicine.  You have vision changes. Summary  The second trimester is from week 13 through week 28 (months 4 through 6). It is also a time when the fetus is growing rapidly.  Your body goes through many changes during pregnancy. The changes vary from woman to woman.  Avoid all smoking, herbs, alcohol, and unprescribed drugs. These chemicals affect the formation and growth your baby.  Do not use any tobacco products, such as cigarettes, chewing tobacco, and e-cigarettes. If you need help  quitting, ask your health care provider.  Contact your health care provider if you have any questions. Keep all prenatal visits as told by your health care provider. This is important. This information is not intended to replace advice given to you by your health care provider. Make sure you discuss any questions you have with your health care provider. Document Released: 08/15/2001 Document Revised: 01/27/2016 Document Reviewed: 10/22/2012 Elsevier Interactive Patient Education  2017 [employer].

## 2016-10-02 NOTE — Addendum Note (Signed)
Addended by: Francene FindersJAMES, QUINETTA C on: 10/02/2016 09:46 AM   Modules accepted: Orders

## 2016-10-03 LAB — GC/CHLAMYDIA PROBE AMP (~~LOC~~) NOT AT ARMC
Chlamydia: NEGATIVE
Neisseria Gonorrhea: NEGATIVE

## 2016-10-04 LAB — CYTOLOGY - PAP
DIAGNOSIS: NEGATIVE
HPV: DETECTED — AB

## 2016-10-04 LAB — CULTURE, OB URINE

## 2016-10-04 LAB — URINE CULTURE, OB REFLEX

## 2016-10-05 ENCOUNTER — Encounter (HOSPITAL_COMMUNITY): Payer: Self-pay | Admitting: Obstetrics and Gynecology

## 2016-10-05 LAB — OBSTETRIC PANEL, INCLUDING HIV
Antibody Screen: NEGATIVE
Basophils Absolute: 0 x10E3/uL (ref 0.0–0.2)
Basos: 0 %
EOS (ABSOLUTE): 0.1 x10E3/uL (ref 0.0–0.4)
Eos: 1 %
HIV Screen 4th Generation wRfx: NONREACTIVE
Hematocrit: 35.9 % (ref 34.0–46.6)
Hemoglobin: 12.2 g/dL (ref 11.1–15.9)
Hepatitis B Surface Ag: NEGATIVE
Immature Grans (Abs): 0.2 x10E3/uL — ABNORMAL HIGH (ref 0.0–0.1)
Immature Granulocytes: 3 %
Lymphocytes Absolute: 2 x10E3/uL (ref 0.7–3.1)
Lymphs: 27 %
MCH: 28.3 pg (ref 26.6–33.0)
MCHC: 34 g/dL (ref 31.5–35.7)
MCV: 83 fL (ref 79–97)
Monocytes Absolute: 0.5 x10E3/uL (ref 0.1–0.9)
Monocytes: 6 %
Neutrophils Absolute: 4.7 x10E3/uL (ref 1.4–7.0)
Neutrophils: 63 %
Platelets: 235 x10E3/uL (ref 150–379)
RBC: 4.31 x10E6/uL (ref 3.77–5.28)
RDW: 13.6 % (ref 12.3–15.4)
RPR Ser Ql: NONREACTIVE
Rh Factor: POSITIVE
Rubella Antibodies, IGG: 16 {index} (ref >0.99)
WBC: 7.5 x10E3/uL (ref 3.4–10.8)

## 2016-10-05 LAB — HEMOGLOBINOPATHY EVALUATION
HGB A: 97.3 % (ref 96.4–98.8)
HGB C: 0 %
HGB S: 0 %
HGB VARIANT: 0 %
Hemoglobin A2 Quantitation: 2.7 % (ref 1.8–3.2)
Hemoglobin F Quantitation: 0 % (ref 0.0–2.0)

## 2016-10-05 LAB — VARICELLA ZOSTER ANTIBODY, IGG

## 2016-10-06 ENCOUNTER — Other Ambulatory Visit: Payer: Self-pay

## 2016-10-06 MED ORDER — FLUCONAZOLE 150 MG PO TABS: ORAL_TABLET | ORAL | 0 refills | Status: DC

## 2016-10-10 LAB — TOXASSURE SELECT 13 (MW), URINE

## 2016-10-11 ENCOUNTER — Other Ambulatory Visit: Payer: Self-pay | Admitting: Obstetrics and Gynecology

## 2016-10-11 ENCOUNTER — Encounter (HOSPITAL_COMMUNITY): Payer: Self-pay

## 2016-10-11 ENCOUNTER — Ambulatory Visit (HOSPITAL_COMMUNITY)
Admission: RE | Admit: 2016-10-11 | Discharge: 2016-10-11 | Disposition: A | Discharge status: Home / Self Care | Payer: Medicaid Other | Source: Ambulatory Visit | Attending: Obstetrics and Gynecology | Admitting: Obstetrics and Gynecology

## 2016-10-11 VITALS — BP 119/62 | HR 76 | Wt 196.6 lb

## 2016-10-11 DIAGNOSIS — O09892 Supervision of other high risk pregnancies, second trimester: Secondary | ICD-10-CM

## 2016-10-11 DIAGNOSIS — O09219 Supervision of pregnancy with history of pre-term labor, unspecified trimester: Secondary | ICD-10-CM

## 2016-10-11 DIAGNOSIS — O09899 Supervision of other high risk pregnancies, unspecified trimester: Secondary | ICD-10-CM

## 2016-10-11 DIAGNOSIS — Z369 Encounter for antenatal screening, unspecified: Secondary | ICD-10-CM

## 2016-10-11 DIAGNOSIS — O0992 Supervision of high risk pregnancy, unspecified, second trimester: Secondary | ICD-10-CM | POA: Insufficient documentation

## 2016-10-11 DIAGNOSIS — Z3A23 23 weeks gestation of pregnancy: Secondary | ICD-10-CM

## 2016-10-11 DIAGNOSIS — O321XX0 Maternal care for breech presentation, not applicable or unspecified: Secondary | ICD-10-CM | POA: Insufficient documentation

## 2016-10-11 DIAGNOSIS — O09212 Supervision of pregnancy with history of pre-term labor, second trimester: Secondary | ICD-10-CM

## 2016-10-11 NOTE — ED Notes (Signed)
[facility] Dr. Sherrie Georgeecker.

## 2016-10-17 ENCOUNTER — Telehealth (HOSPITAL_COMMUNITY): Payer: Self-pay | Admitting: *Deleted

## 2016-10-24 ENCOUNTER — Encounter: Payer: Medicaid Other | Admitting: Obstetrics and Gynecology

## 2016-10-24 ENCOUNTER — Ambulatory Visit (INDEPENDENT_AMBULATORY_CARE_PROVIDER_SITE_OTHER): Payer: Medicaid Other | Admitting: Obstetrics and Gynecology

## 2016-10-24 VITALS — BP 105/62 | HR 79 | Wt 198.7 lb

## 2016-10-24 DIAGNOSIS — O26872 Cervical shortening, second trimester: Secondary | ICD-10-CM

## 2016-10-24 DIAGNOSIS — O09212 Supervision of pregnancy with history of pre-term labor, second trimester: Secondary | ICD-10-CM

## 2016-10-24 DIAGNOSIS — O0992 Supervision of high risk pregnancy, unspecified, second trimester: Secondary | ICD-10-CM

## 2016-10-24 DIAGNOSIS — O09892 Supervision of other high risk pregnancies, second trimester: Secondary | ICD-10-CM

## 2016-10-24 DIAGNOSIS — O26879 Cervical shortening, unspecified trimester: Secondary | ICD-10-CM | POA: Insufficient documentation

## 2016-10-24 MED ORDER — PROGESTERONE MICRONIZED 200 MG PO CAPS: ORAL_CAPSULE | ORAL | 3 refills | Status: DC

## 2016-10-24 NOTE — Progress Notes (Signed)
Subjective:  Kim Robertson is a 36 y.o. G3P1102 at 5718w5d being seen today for ongoing prenatal care.  She is currently monitored for the following issues for this high-risk pregnancy and has History of preterm delivery, currently pregnant in second trimester; Supervision of high-risk pregnancy; and Cervical shortening on her problem list.  Patient reports constipation.  Contractions: Not present. Vag. Bleeding: None.  Movement: Present. Denies leaking of fluid.   The following portions of the patient's history were reviewed and updated as appropriate: allergies, current medications, past family history, past medical history, past social history, past surgical history and problem list. Problem list updated.  Objective:   Vitals:   10/24/16 0946  BP: 105/62  Pulse: 79  Weight: 198 lb 11.2 oz (90.1 kg)    Fetal Status:     Movement: Present     General:  Alert, oriented and cooperative. Patient is in no acute distress.  Skin: Skin is warm and dry. No rash noted.   Cardiovascular: Normal heart rate noted  Respiratory: Normal respiratory effort, no problems with respiration noted  Abdomen: Soft, gravid, appropriate for gestational age. Pain/Pressure: Present     Pelvic:  Cervical exam deferred        Extremities: Normal range of motion.  Edema: Trace  Mental Status: Normal mood and affect. Normal behavior. Normal judgment and thought content.   Urinalysis:      Assessment and Plan:  Pregnancy: G3P1102 at 7818w5d  1. 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 Started on Prometrium at that time. Following CL in MFM. Next CL tomorrow. She denies any cramps or ut ctx today - progesterone (PROMETRIUM) 200 MG capsule; Place one capsule vaginally at bedtime  Dispense: 30 capsule; Refill: 3  2. Supervision of high risk pregnancy in second trimester As noted above Glucola with next OB visit  3. History of preterm delivery, currently pregnant in second trimester D/T PROM.  With current problem of shorten cervix one would have to question if this is cervical incompetent   Preterm labor symptoms and general obstetric precautions including but not limited to vaginal bleeding, contractions, leaking of fluid and fetal movement were reviewed in detail with the patient. Please refer to After Visit Summary for other counseling recommendations.  Return in about 2 weeks (around 11/07/2016) for OB visit.   Hermina StaggersMichael L Dorlene Footman, MD

## 2016-10-24 NOTE — Addendum Note (Signed)
Addended by: Hermina StaggersERVIN, Ama Mcmaster L on: 10/24/2016 10:35 AM   Modules accepted: Orders

## 2016-10-25 ENCOUNTER — Ambulatory Visit (HOSPITAL_COMMUNITY)
Admission: RE | Admit: 2016-10-25 | Discharge: 2016-10-25 | Disposition: A | Discharge status: Home / Self Care | Payer: Medicaid Other | Source: Ambulatory Visit | Attending: Obstetrics and Gynecology | Admitting: Obstetrics and Gynecology

## 2016-10-25 ENCOUNTER — Encounter (HOSPITAL_COMMUNITY): Payer: Self-pay

## 2016-10-25 DIAGNOSIS — O09212 Supervision of pregnancy with history of pre-term labor, second trimester: Secondary | ICD-10-CM | POA: Diagnosis not present

## 2016-10-25 DIAGNOSIS — O09892 Supervision of other high risk pregnancies, second trimester: Secondary | ICD-10-CM

## 2016-10-25 DIAGNOSIS — O99212 Obesity complicating pregnancy, second trimester: Secondary | ICD-10-CM | POA: Diagnosis not present

## 2016-10-25 DIAGNOSIS — Z3A25 25 weeks gestation of pregnancy: Secondary | ICD-10-CM | POA: Insufficient documentation

## 2016-10-25 MED ORDER — BETAMETHASONE SOD PHOS & ACET 6 (3-3) MG/ML IJ SUSP
12.0000 mg | Freq: Once | INTRAMUSCULAR | Status: AC
  Administered 2016-10-25: 12 mg via INTRAMUSCULAR
  Filled 2016-10-25: qty 2

## 2016-10-26 ENCOUNTER — Ambulatory Visit (HOSPITAL_COMMUNITY)
Admission: RE | Admit: 2016-10-26 | Discharge: 2016-10-26 | Disposition: A | Discharge status: Home / Self Care | Payer: Medicaid Other | Source: Ambulatory Visit | Attending: Obstetrics and Gynecology | Admitting: Obstetrics and Gynecology

## 2016-10-26 DIAGNOSIS — O26872 Cervical shortening, second trimester: Secondary | ICD-10-CM | POA: Insufficient documentation

## 2016-10-26 DIAGNOSIS — Z8751 Personal history of pre-term labor: Secondary | ICD-10-CM | POA: Insufficient documentation

## 2016-10-26 DIAGNOSIS — Z3A25 25 weeks gestation of pregnancy: Secondary | ICD-10-CM | POA: Insufficient documentation

## 2016-10-26 MED ORDER — BETAMETHASONE SOD PHOS & ACET 6 (3-3) MG/ML IJ SUSP
12.0000 mg | Freq: Once | INTRAMUSCULAR | Status: DC
  Filled 2016-10-26: qty 2

## 2016-10-26 NOTE — ED Notes (Signed)
Pt given second dose of BMZ on RT side.  Pt tolerated well.

## 2016-10-30 ENCOUNTER — Encounter: Payer: Self-pay | Admitting: *Deleted

## 2016-10-31 ENCOUNTER — Telehealth: Payer: Self-pay | Admitting: *Deleted

## 2016-10-31 NOTE — Telephone Encounter (Signed)
Patient called this morning wanting to set up her 17P injections, She states she was told by the doctor that she could do both vaginal Progesterone and injections. In review I did see her visit with Dr Sherrie Georgeecker and the conversation and [facility] I told patient she could come this week and we would start her- I went ahead and [facility] so it would be in process. Just wanted to let you know what I did.

## 2016-11-06 ENCOUNTER — Telehealth: Payer: Self-pay

## 2016-11-06 NOTE — Telephone Encounter (Signed)
Returned call, no answer, left vm 

## 2016-11-07 ENCOUNTER — Ambulatory Visit (INDEPENDENT_AMBULATORY_CARE_PROVIDER_SITE_OTHER): Payer: Medicaid Other | Admitting: Obstetrics and Gynecology

## 2016-11-07 ENCOUNTER — Other Ambulatory Visit: Payer: Medicaid Other

## 2016-11-07 VITALS — BP 113/59 | HR 84 | Wt 197.7 lb

## 2016-11-07 DIAGNOSIS — O09212 Supervision of pregnancy with history of pre-term labor, second trimester: Secondary | ICD-10-CM | POA: Diagnosis not present

## 2016-11-07 DIAGNOSIS — O09892 Supervision of other high risk pregnancies, second trimester: Secondary | ICD-10-CM

## 2016-11-07 DIAGNOSIS — O0992 Supervision of high risk pregnancy, unspecified, second trimester: Secondary | ICD-10-CM

## 2016-11-07 DIAGNOSIS — O26872 Cervical shortening, second trimester: Secondary | ICD-10-CM

## 2016-11-07 MED ORDER — HYDROXYPROGESTERONE CAPROATE 250 MG/ML IM OIL
250.0000 mg | TOPICAL_OIL | INTRAMUSCULAR | Status: AC
  Administered 2016-11-07 – 2017-01-02 (×8): 250 mg via INTRAMUSCULAR

## 2016-11-07 NOTE — Addendum Note (Signed)
Addended by: Marya LandryFOSTER, SUZANNE D on: 11/07/2016 09:14 AM   Modules accepted: Orders

## 2016-11-07 NOTE — Progress Notes (Addendum)
Pt was started on 17p injections today. Injection given, pt tolerated well.  Administrations This Visit    hydroxyprogesterone caproate (MAKENA) 250 mg/mL injection 250 mg    Admin Date 11/07/2016 Action Given Dose 250 mg Route Intramuscular Administered By Lanney GinsSuzanne D Foster, CMA

## 2016-11-07 NOTE — Progress Notes (Signed)
Subjective:  Kim Robertson is a 36 y.o. G3P1102 at 2858w5d being seen today for ongoing prenatal care.  She is currently monitored for the following issues for this high-risk pregnancy and has History of preterm delivery, currently pregnant in second trimester; Supervision of high-risk pregnancy; and Cervical shortening on her problem list.  Patient reports no ctx, vaginal pressure, cramps, bleeding or LOF..  Contractions: Not present. Vag. Bleeding: None.  Movement: Present. Denies leaking of fluid.   The following portions of the patient's history were reviewed and updated as appropriate: allergies, current medications, past family history, past medical history, past social history, past surgical history and problem list. Problem list updated.  Objective:   Vitals:   11/07/16 0828  BP: (!) 113/59  Pulse: 84  Weight: 197 lb 11.2 oz (89.7 kg)    Fetal Status:     Movement: Present     General:  Alert, oriented and cooperative. Patient is in no acute distress.  Skin: Skin is warm and dry. No rash noted.   Cardiovascular: Normal heart rate noted  Respiratory: Normal respiratory effort, no problems with respiration noted  Abdomen: Soft, gravid, appropriate for gestational age. Pain/Pressure: Absent     Pelvic:  Cervical exam deferred        Extremities: Normal range of motion.  Edema: None  Mental Status: Normal mood and affect. Normal behavior. Normal judgment and thought content.   Urinalysis: Urine Protein: Negative Urine Glucose: Negative  Assessment and Plan:  Pregnancy: G3P1102 at 6458w5d  1. 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 Was given BMZ 2/21 and 2/22 MFM recommended adding 17 OHP.  There was some confusing about starting per pt. She wanted to discuss with me today. I reviewed with pt and agree with MFM's recommendation of adding 17 OHP. She is agreeable. Will start today Continue with Prometrium. CL tomorrow.  2. Supervision of high  risk pregnancy in second trimester  - Glucose Tolerance, 2 Hours w/1 Hour - CBC - HIV antibody - RPR  3. History of preterm delivery, currently pregnant in second trimester D/T PROM. As this pregnancy continues to progress and CL changes, I think the pt has more of a picture of  incompetent cervix.  Consideration and discussion of cerclage with future pregnancies   Preterm labor symptoms and general obstetric precautions including but not limited to vaginal bleeding, contractions, leaking of fluid and fetal movement were reviewed in detail with the patient. Please refer to After Visit Summary for other counseling recommendations.  Return in about 2 weeks (around 11/21/2016).   Hermina StaggersMichael L Dwayna Kentner, MD

## 2016-11-07 NOTE — Patient Instructions (Signed)
Preventing Preterm Birth °Preterm birth is when your baby is delivered between 20 weeks and 37 weeks of pregnancy. A full-term pregnancy lasts for at least 37 weeks. Preterm birth can be dangerous for your baby because the last few weeks of pregnancy are an important time for your baby's brain and lungs to grow. Many things can cause a baby to be born early. Sometimes the cause is not known. There are certain factors that make you more likely to experience preterm birth, such as: °· Having a previous baby born preterm. °· Being pregnant with twins or other multiples. °· Having had fertility treatment. °· Being overweight or underweight at the start of your pregnancy. °· Having any of the following during pregnancy: °¨ An infection, including a urinary tract infection (UTI) or an STI (sexually transmitted infection). °¨ High blood pressure. °¨ Diabetes. °¨ Vaginal bleeding. °· Being age 35 or older. °· Being age 18 or younger. °· Getting pregnant within 6 months of a previous pregnancy. °· Suffering extreme stress or physical or emotional abuse during pregnancy. °· Standing for long periods of time during pregnancy, such as working at a job that requires standing. °What are the risks? °The most serious risk of preterm birth is that the baby may not survive. This is more likely to happen if a baby is born before 34 weeks. Other risks and complications of preterm birth may include your baby having: °· Breathing problems. °· Brain damage that affects movement and coordination (cerebral palsy). °· Feeding difficulties. °· Vision or hearing problems. °· Infections or inflammation of the digestive tract (colitis). °· Developmental delays. °· Learning disabilities. °· Higher risk for diabetes, heart disease, and high blood pressure later in life. °What can I do to lower my risk? °Medical care °The most important thing you can do to lower your risk for preterm birth is to get routine medical care during pregnancy (prenatal  care). If you have a high risk of preterm birth, you may be referred to a health care provider who specializes in managing high-risk pregnancies (perinatologist). You may be given medicine to help prevent preterm birth. °Lifestyle changes °Certain lifestyle changes can also lower your risk of preterm birth: °· Wait at least 6 months after a pregnancy to become pregnant again. °· Try to plan pregnancy for when you are between 19 and 35 years old. °· Get to a healthy weight before getting pregnant. If you are overweight, work with your health care provider to safely lose weight. °· Do not use any products that contain nicotine or tobacco, such as cigarettes and e-cigarettes. If you need help quitting, ask your health care provider. °· Do not drink alcohol. °· Do not use drugs. °Where to find support: °For more support, consider: °· Talking with your health care provider. °· Talking with a therapist or substance abuse counselor, if you need help quitting. °· Working with a diet and nutrition specialist (dietitian) or a personal trainer to maintain a healthy weight. °· Joining a support group. °Where to find more information: °Learn more about preventing preterm birth from: °· Centers for Disease Control and Prevention: cdc.gov/reproductivehealth/maternalinfanthealth/pretermbirth.htm °· March of Dimes: marchofdimes.org/complications/premature-babies.aspx °· American Pregnancy Association: americanpregnancy.org/labor-and-birth/premature-labor °Contact a health care provider if: °· You have any of the following signs of preterm labor before 37 weeks: °¨ A change or increase in vaginal discharge. °¨ Fluid leaking from your vagina. °¨ Pressure or cramps in your lower abdomen. °¨ A backache that does not go away or gets worse. °¨   Regular tightening (contractions) in your lower abdomen. °Summary °· Preterm birth means having your baby during weeks 20-37 of pregnancy. °· Preterm birth may put your baby at risk for physical and  mental problems. °· Getting good prenatal care can help prevent preterm birth. °· You can lower your risk of preterm birth by making certain lifestyle changes, such as not smoking and not using alcohol. °This information is not intended to replace advice given to you by your health care provider. Make sure you discuss any questions you have with your health care provider. °Document Released: 10/05/2015 Document Revised: 04/29/2016 Document Reviewed: 04/29/2016 °Elsevier Interactive Patient Education © 2017 Elsevier Inc. ° °

## 2016-11-08 ENCOUNTER — Ambulatory Visit (HOSPITAL_COMMUNITY)
Admission: RE | Admit: 2016-11-08 | Discharge: 2016-11-08 | Disposition: A | Discharge status: Home / Self Care | Payer: Medicaid Other | Source: Ambulatory Visit | Attending: Obstetrics and Gynecology | Admitting: Obstetrics and Gynecology

## 2016-11-08 ENCOUNTER — Encounter (HOSPITAL_COMMUNITY): Payer: Self-pay

## 2016-11-08 DIAGNOSIS — O99212 Obesity complicating pregnancy, second trimester: Secondary | ICD-10-CM | POA: Insufficient documentation

## 2016-11-08 DIAGNOSIS — O26872 Cervical shortening, second trimester: Secondary | ICD-10-CM | POA: Diagnosis not present

## 2016-11-08 DIAGNOSIS — O09892 Supervision of other high risk pregnancies, second trimester: Secondary | ICD-10-CM

## 2016-11-08 DIAGNOSIS — O09212 Supervision of pregnancy with history of pre-term labor, second trimester: Secondary | ICD-10-CM | POA: Diagnosis not present

## 2016-11-08 DIAGNOSIS — Z3A27 27 weeks gestation of pregnancy: Secondary | ICD-10-CM | POA: Diagnosis not present

## 2016-11-08 LAB — CBC
HEMATOCRIT: 36.4 % (ref 34.0–46.6)
Hemoglobin: 11.8 g/dL (ref 11.1–15.9)
MCH: 27.3 pg (ref 26.6–33.0)
MCHC: 32.4 g/dL (ref 31.5–35.7)
MCV: 84 fL (ref 79–97)
Platelets: 215 10*3/uL (ref 150–379)
RBC: 4.32 x10E6/uL (ref 3.77–5.28)
RDW: 14.3 % (ref 12.3–15.4)
WBC: 12.2 10*3/uL — AB (ref 3.4–10.8)

## 2016-11-08 LAB — HIV ANTIBODY (ROUTINE TESTING W REFLEX): HIV Screen 4th Generation wRfx: NONREACTIVE

## 2016-11-08 LAB — GLUCOSE TOLERANCE, 2 HOURS W/ 1HR
Glucose, 1 hour: 153 mg/dL (ref 65–179)
Glucose, 2 hour: 194 mg/dL — ABNORMAL HIGH (ref 65–152)
Glucose, Fasting: 99 mg/dL — ABNORMAL HIGH (ref 65–91)

## 2016-11-08 LAB — RPR: RPR Ser Ql: NONREACTIVE

## 2016-11-14 ENCOUNTER — Ambulatory Visit (INDEPENDENT_AMBULATORY_CARE_PROVIDER_SITE_OTHER): Payer: Medicaid Other | Admitting: *Deleted

## 2016-11-14 ENCOUNTER — Encounter: Payer: Self-pay | Admitting: *Deleted

## 2016-11-14 VITALS — BP 109/70 | HR 83

## 2016-11-14 DIAGNOSIS — O09212 Supervision of pregnancy with history of pre-term labor, second trimester: Secondary | ICD-10-CM

## 2016-11-14 DIAGNOSIS — O24419 Gestational diabetes mellitus in pregnancy, unspecified control: Secondary | ICD-10-CM

## 2016-11-14 MED ORDER — GLUCOSE BLOOD VI STRP: ORAL_STRIP | 5 refills | Status: DC

## 2016-11-14 MED ORDER — ACCU-CHEK MULTICLIX LANCETS MISC: 5 refills | Status: DC

## 2016-11-14 MED ORDER — ACCU-CHEK GUIDE W/DEVICE KIT: 1.0000 | PACK | Freq: Once | 0 refills | Status: AC

## 2016-11-14 NOTE — Progress Notes (Signed)
Pt is in office for 17p injection. Pt tolertated injection well. Recent labs were reviewed with pt regarding GDM. Diabetic testing supplies were sent to pt pharmacy today. Referral for Nutrition/Diabetes ordered.  Pt made aware of need for testing and instructed on testing her glucose daily.  Pt was advised to call office if any question regarding her supplies or testing.    Administrations This Visit    hydroxyprogesterone caproate (MAKENA) 250 mg/mL injection 250 mg    Admin Date 11/14/2016 Action Given Dose 250 mg Route Intramuscular Administered By Lanney GinsSuzanne D Caitlan [place]auc[place], CMA

## 2016-11-15 ENCOUNTER — Encounter (HOSPITAL_COMMUNITY): Payer: Self-pay | Admitting: *Deleted

## 2016-11-15 ENCOUNTER — Inpatient Hospital Stay (HOSPITAL_COMMUNITY)
Admission: AD | Admit: 2016-11-15 | Discharge: 2016-11-15 | Disposition: A | Discharge status: Home / Self Care | Payer: Medicaid Other | Source: Ambulatory Visit | Attending: Family Medicine | Admitting: Family Medicine

## 2016-11-15 DIAGNOSIS — O479 False labor, unspecified: Secondary | ICD-10-CM

## 2016-11-15 DIAGNOSIS — R109 Unspecified abdominal pain: Secondary | ICD-10-CM

## 2016-11-15 DIAGNOSIS — O4703 False labor before 37 completed weeks of gestation, third trimester: Secondary | ICD-10-CM | POA: Diagnosis not present

## 2016-11-15 DIAGNOSIS — O26899 Other specified pregnancy related conditions, unspecified trimester: Secondary | ICD-10-CM | POA: Diagnosis not present

## 2016-11-15 DIAGNOSIS — Z3A28 28 weeks gestation of pregnancy: Secondary | ICD-10-CM | POA: Diagnosis not present

## 2016-11-15 LAB — URINALYSIS, ROUTINE W REFLEX MICROSCOPIC
BILIRUBIN URINE: NEGATIVE
Glucose, UA: NEGATIVE mg/dL
HGB URINE DIPSTICK: NEGATIVE
KETONES UR: NEGATIVE mg/dL
Leukocytes, UA: NEGATIVE
NITRITE: NEGATIVE
PH: 6 (ref 5.0–8.0)
Protein, ur: NEGATIVE mg/dL
SPECIFIC GRAVITY, URINE: 1.024 (ref 1.005–1.030)

## 2016-11-15 NOTE — MAU Note (Signed)
Fetus very active and patient moving around so tracing intermittent. Fetal tracing in the 140s. Patient reports good fetal movement and no uterine ctx since on the monitor.

## 2016-11-15 NOTE — MAU Note (Signed)
PT  SAYS   SHE FEELS UC     STARTED    AT 2345.    PNC- WITH   FAMINA   .    WENT  TO MFM-  GAVE    17- P   SHOTS.       LAST SEX-      6 MTHS  AGO.

## 2016-11-15 NOTE — Discharge Instructions (Signed)

## 2016-11-15 NOTE — MAU Provider Note (Signed)
Chief Complaint: Abdominal Pain   First Provider Initiated Contact with Patient 11/15/16 0145     SUBJECTIVE HPI: Kim Robertson is a 36 y.o. G3P1102 at [redacted]w[redacted]d who presents to Maternity Admissions reporting acute onset contractions since midnight. Abdominal pain was diffuse including the fundus and constant with pressure-like character. Abdominal pain is now resolved. Patient endorses positive fetal movement and denies leakage of vaginal fluid or bleeding. Pt has cervical shortening by [place] and is followed by MFM with 17P injections receiving last dose yesterday.  Pregnancy Course:   Past Medical History:  Diagnosis Date  . Preterm labor    OB History  Gravida Para Term Preterm AB Living  3 2 1 1   2   SAB TAB Ectopic Multiple Live Births        0 2    # Outcome Date GA Lbr Len/2nd Weight Sex Delivery Anes PTL Lv  3 Current           2 Term 05/27/15 [redacted]w[redacted]d 07:43 / 00:28 8 lb 10.3 oz (3.92 kg) M Vag-Spont Local  LIV     Birth Comments: NONE  1 Preterm 12/14/12 [redacted]w[redacted]d 46:39 / 02:04 2 lb 4.7 oz (1.04 kg) M Vag-Spont None  LIV     Past Surgical History:  Procedure Laterality Date  . NO PAST SURGERIES     History reviewed. No pertinent family history. Social History  Substance Use Topics  . Smoking status: Never Smoker  . Smokeless tobacco: Never Used  . Alcohol use No   Allergies  Allergen Reactions  . Neosporin [Neomycin-Bacitracin Zn-Polymyx] Rash   Facility-Administered Medications Prior to Admission  Medication Dose Route Frequency Provider Last Rate Last Dose  . hydroxyprogesterone caproate (MAKENA) 250 mg/mL injection 250 mg  250 mg Intramuscular Weekly Hermina Staggers, MD   250 mg at 11/14/16 1431   Prescriptions Prior to Admission  Medication Sig Dispense Refill Last Dose  . docusate sodium (COLACE) 100 MG capsule Take 100 mg by mouth 2 (two) times daily.   Not Taking  . glucose blood (ACCU-CHEK GUIDE) test strip Use as instructed 100 each 5   . Lancets (ACCU-CHEK  MULTICLIX) lancets Check blood glucose levels 4 times daily 100 each 5   . polyethylene glycol (MIRALAX / GLYCOLAX) packet Take 17 g by mouth daily.   Taking  . Prenatal Vit-Fe Fumarate-FA (PRENATAL MULTIVITAMIN) TABS tablet Take 1 tablet by mouth daily.   Taking  . progesterone (PROMETRIUM) 200 MG capsule Place one capsule vaginally at bedtime 30 capsule 3 Taking    I have reviewed patient's Past Medical Hx, Surgical Hx, Family Hx, Social Hx, medications and allergies.   ROS:  Review of Systems  Constitutional: Negative for chills and fever.  HENT: Negative for congestion and sore throat.   Eyes: Negative for photophobia and visual disturbance.  Respiratory: Negative for cough, shortness of breath and wheezing.   Cardiovascular: Negative for palpitations and leg swelling.  Gastrointestinal: Positive for abdominal pain and nausea. Negative for vomiting.  Genitourinary: Negative for dysuria, frequency, pelvic pain, urgency, vaginal bleeding, vaginal discharge and vaginal pain.  Musculoskeletal: Negative for back pain and neck pain.  Neurological: Negative for light-headedness and headaches.    Physical Exam   Patient Vitals for the past 24 hrs:  BP Temp Temp src Pulse Resp Weight  11/15/16 0121 128/65 97.7 F (36.5 C) Oral 75 18 199 lb 12 oz (90.6 kg)   Constitutional: Well-developed, well-nourished female in no acute distress.  Cardiovascular: normal rate Respiratory:  normal effort GI: Abd soft, non-tender, gravid appropriate for gestational age. Pos BS x 4 MS: Extremities nontender, no edema, normal ROM Neurologic: Alert and oriented x 4.  GU: Neg CVAT.  Pelvic: NEFG, physiologic discharge, no blood, cervix clean. No CMT     FHT:  Baseline 145, moderate variability, accelerations present, no decelerations Contractions: none   Labs: Results for orders placed or performed during the hospital encounter of 11/15/16 (from the past 24 hour(s))  Urinalysis, Routine w reflex  microscopic     Status: None   Collection Time: 11/15/16  1:24 AM  Result Value Ref Range   Color, Urine YELLOW YELLOW   APPearance CLEAR CLEAR   Specific Gravity, Urine 1.024 1.005 - 1.030   pH 6.0 5.0 - 8.0   Glucose, UA NEGATIVE NEGATIVE mg/dL   Hgb urine dipstick NEGATIVE NEGATIVE   Bilirubin Urine NEGATIVE NEGATIVE   Ketones, ur NEGATIVE NEGATIVE mg/dL   Protein, ur NEGATIVE NEGATIVE mg/dL   Nitrite NEGATIVE NEGATIVE   Leukocytes, UA NEGATIVE NEGATIVE    Imaging:  [place] Mfm Ob Transvaginal  Result Date: 11/08/2016 ----------------------------------------------------------------------  OBSTETRICS REPORT                      (Signed Final 11/08/2016 06:18 pm) ---------------------------------------------------------------------- Patient Info  ID #:       [id number]                         D.O.B.:   1981-08-23 (35 yrs)  Name:       Kim Robertson                 Visit Date:  11/08/2016 02:03 pm ---------------------------------------------------------------------- Performed By  Performed By:     Marcellina Millin          Ref. Address:     [address]                                                             [place], [place]                                                             [address]  Attending:        Particia Nearing MD       Location:         [facility]  Referred By:      Hermina Staggers                    MD ---------------------------------------------------------------------- Orders   #  Description  Code   1  [place] MFM OB TRANSVAGINAL                      [id number]   2  [place] MFM OB FOLLOW UP                         [id number]  ----------------------------------------------------------------------   #  Ordered By               Order #        Accession #    Episode #   1  Particia Nearing            [id number]      [id number]     [id number]   2  MARTHA DECKER            [id number]       [id number]     [id number]  ---------------------------------------------------------------------- Indications   [redacted] weeks gestation of pregnancy                Z3A.27   Poor obstetric history: Previous preterm       O09.219   delivery due to PROM (26 weeks and   delivered at 27 weeks)   Obesity complicating pregnancy, second         O99.212   trimester   Cervical shortening, second trimester          O26.872  ---------------------------------------------------------------------- OB History  Blood Type:            Height:         Weight (lb):  196      BMI:  Gravidity:    3         Term:   1        Prem:   1        SAB:   0  TOP:          0       Ectopic:  0        Living: 2 ---------------------------------------------------------------------- Fetal Evaluation  Num Of Fetuses:     1  Fetal Heart         167  Rate(bpm):  Cardiac Activity:   Observed  Presentation:       Cephalic  Placenta:           Fundal, above cervical os  P. Cord Insertion:  Visualized  Amniotic Fluid  AFI FV:      Subjectively within normal limits                              Largest Pocket(cm)                              5.6 ---------------------------------------------------------------------- Biometry  BPD:      67.2  mm     G. Age:  27w 1d         16  %    CI:         75.2   %   70 - 86  FL/HC:      21.4   %   18.8 - 20.6  HC:      245.8  mm     G. Age:  26w 5d        < 3  %    HC/AC:      0.99       1.05 - 1.21  AC:      247.7  mm     G. Age:  29w 0d         76  %    FL/BPD:     78.3   %   71 - 87  FL:       52.6  mm     G. Age:  28w 0d         39  %    FL/AC:      21.2   %   20 - 24  Est. FW:    1205  gm    2 lb 11 oz      60  % ---------------------------------------------------------------------- Gestational Age  LMP:           22w 3d       Date:   06/04/16                 EDD:   03/11/17  U/S Today:     27w 5d                                        EDD:   02/02/17  Best:           27w 6d    Det. By:   U/S  (10/11/16)          EDD:   02/01/17 ---------------------------------------------------------------------- Anatomy  Cranium:               Appears normal         Aortic Arch:            Previously seen  Cavum:                 Appears normal         Ductal Arch:            Previously seen  Ventricles:            Appears normal         Diaphragm:              Appears normal  Choroid Plexus:        Previously seen        Stomach:                Appears normal, left                                                                        sided  Cerebellum:            Previously seen        Abdomen:                Appears normal  Posterior Fossa:  Previously seen        Abdominal Wall:         Previously seen  Nuchal Fold:           Not applicable (>20    Cord Vessels:           Previously seen                         wks GA)  Face:                  Orbits and profile     Kidneys:                Appear normal                         previously seen  Lips:                  Previously seen        Bladder:                Appears normal  Thoracic:              Appears normal         Spine:                  Previously seen  Heart:                 Appears normal         Upper Extremities:      Previously seen                         (4CH, axis, and                         situs)  RVOT:                  Appears normal         Lower Extremities:      Previously seen  LVOT:                  Appears normal  Other:  Female gender. Technically difficult due to fetal position. ---------------------------------------------------------------------- Cervix Uterus Adnexa  Cervix  Length:            1.1  cm.  Appears funnelled, see comments. ---------------------------------------------------------------------- Impression  SIUP at 27+6 weeks  Normal interval anatomy; anatomic survey complete  Normal amniotic fluid volume  Appropriate interval growth with EFW at the 60th %tile  EV views of cervix: funneling with  distal closed portion  measuring 1.1 cms ---------------------------------------------------------------------- Recommendations  Continue 17P and vaginal progesterone  Decrease activity level  Follow cervix with cervical exam if indicated ----------------------------------------------------------------------                 Particia Nearing, MD Electronically Signed Final Report   11/08/2016 06:18 pm ----------------------------------------------------------------------  [place] Mfm Ob Transvaginal  Result Date: 10/25/2016 ----------------------------------------------------------------------  OBSTETRICS REPORT                      (Signed Final 10/25/2016 06:40 pm) ---------------------------------------------------------------------- Patient Info  ID #:       [id number]                         D.O.B.:  [birth year] (35 yrs)  Name:       Kim Robertson                 Visit Date:  10/25/2016 01:36 pm ---------------------------------------------------------------------- Performed By  Performed By:     Percell BostonHeather Waken          Ref. Address:     [address]                    [address]                                                             [address]                                                             [place], [place]                                                             [id number]  Attending:        Particia NearingMartha Decker MD       Location[facility]         [facility]  Referred By:      Hermina StaggersMICHAEL L ERVIN                    MD ---------------------------------------------------------------------- Orders   #  Description                                 Code   1  US MFM OB TRANSVAGINAL                      [id number]  ----------------------------------------------------------------------   #  Ordered By               Order #        Accession #    Episode #   1  Particia NearingMARTHA DECKER            [id number]      [id number]     [id number]  ---------------------------------------------------------------------- Indications   [redacted] weeks gestation of  pregnancy                Z3A.25   Poor obstetric history: Previous preterm       O09.219   delivery due to PROM (26 weeks and   delivered at 27 weeks)   Obesity complicating pregnancy, second         O99.212   trimester  ---------------------------------------------------------------------- OB History  Blood Type:            Height:         Weight (lb):  196      BMI:  Gravidity:    3         Term:   1  Prem:   1        SAB:   0  TOP:          0       Ectopic:  0        Living: 2 ---------------------------------------------------------------------- Fetal Evaluation  Num Of Fetuses:     1  Fetal Heart         154  Rate(bpm):  Cardiac Activity:   Observed  Presentation:       Breech ---------------------------------------------------------------------- Gestational Age  LMP:           20w 3d       Date:   06/04/16                 EDD:   03/11/17  Best:          25w 6d    Det. By:   U/S  (10/11/16)          EDD:   02/01/17 ---------------------------------------------------------------------- Cervix Uterus Adnexa  Cervix  Length:            1.4  cm.  Funneling of internal os noted. Dynamic changes noted 1.4 to 2.2 cm. ---------------------------------------------------------------------- Impression  SIUP at 25+5 weeks  Normal amniotic fluid volume  EV views of cervix: funneling with the distal closed portion  measuring 1.4 - 2.2 cms  The US findings were shared with Ms. Kegley. She would  like to be on weekly 17P as well as vaginal progesterone. I  offered her a course of BMZ and she agreed. #1 given today ---------------------------------------------------------------------- Recommendations  Follow-up ultrasound for cervical length and growth in 2  weeks ----------------------------------------------------------------------                 Particia Nearing, MD Electronically Signed Final Report   10/25/2016 06:40 pm ----------------------------------------------------------------------  [place] Mfm Ob Follow  Up  Result Date: 11/08/2016 ----------------------------------------------------------------------  OBSTETRICS REPORT                      (Signed Final 11/08/2016 06:18 pm) ---------------------------------------------------------------------- Patient Info  ID #:       [id number]                         D.O.B.:   1981-08-06 (35 yrs)  Name:       Kim Robertson                 Visit Date:  11/08/2016 02:03 pm ---------------------------------------------------------------------- Performed By  Performed By:     Marcellina Millin          Ref. Address:     [address]  [place], [place]                                                             [address]  Attending:        Particia Nearing MD       Location:         [facility]  Referred By:      Hermina Staggers                    MD ---------------------------------------------------------------------- Orders   #  Description                                 Code   1  [place] MFM OB TRANSVAGINAL                      [phone]   2  [place] MFM OB FOLLOW UP                         29562.13  ----------------------------------------------------------------------   #  Ordered By               Order #        Accession #    Episode #   1  Particia Nearing            [id number]      [id number]     [id number]   2  MARTHA DECKER            [id number]      [id number]     [id number]  ---------------------------------------------------------------------- Indications   [redacted] weeks gestation of pregnancy                Z3A.27   Poor obstetric history: Previous preterm       O09.219   delivery due to PROM (26 weeks and   delivered at 27 weeks)   Obesity complicating pregnancy, second         O99.212   trimester   Cervical shortening, second trimester          O26.872  ---------------------------------------------------------------------- OB History  Blood Type:             Height:         Weight (lb):  196      BMI:  Gravidity:    3         Term:   1        Prem:   1        SAB:   0  TOP:          0       Ectopic:  0        Living: 2 ---------------------------------------------------------------------- Fetal Evaluation  Num Of Fetuses:     1  Fetal Heart         167  Rate(bpm):  Cardiac Activity:   Observed  Presentation:       Cephalic  Placenta:           Fundal, above cervical os  P. Cord Insertion:  Visualized  Amniotic Fluid  AFI FV:      Subjectively within normal limits  Largest Pocket(cm)                              5.6 ---------------------------------------------------------------------- Biometry  BPD:      67.2  mm     G. Age:  27w 1d         16  %    CI:         75.2   %   70 - 86                                                          FL/HC:      21.4   %   18.8 - 20.6  HC:      245.8  mm     G. Age:  26w 5d        < 3  %    HC/AC:      0.99       1.05 - 1.21  AC:      247.7  mm     G. Age:  29w 0d         76  %    FL/BPD:     78.3   %   71 - 87  FL:       52.6  mm     G. Age:  28w 0d         39  %    FL/AC:      21.2   %   20 - 24  Est. FW:    1205  gm    2 lb 11 oz      60  % ---------------------------------------------------------------------- Gestational Age  LMP:           22w 3d       Date:   06/04/16                 EDD:   03/11/17  U/S Today:     27w 5d                                        EDD:   02/02/17  Best:          27w 6d    Det. By:   U/S  (10/11/16)          EDD:   02/01/17 ---------------------------------------------------------------------- Anatomy  Cranium:               Appears normal         Aortic Arch:            Previously seen  Cavum:                 Appears normal         Ductal Arch:            Previously seen  Ventricles:            Appears normal         Diaphragm:              Appears normal  Choroid Plexus:        Previously seen  Stomach:                Appears normal, left                                                                         sided  Cerebellum:            Previously seen        Abdomen:                Appears normal  Posterior Fossa:       Previously seen        Abdominal Wall:         Previously seen  Nuchal Fold:           Not applicable (>20    Cord Vessels:           Previously seen                         wks GA)  Face:                  Orbits and profile     Kidneys:                Appear normal                         previously seen  Lips:                  Previously seen        Bladder:                Appears normal  Thoracic:              Appears normal         Spine:                  Previously seen  Heart:                 Appears normal         Upper Extremities:      Previously seen                         (4CH, axis, and                         situs)  RVOT:                  Appears normal         Lower Extremities:      Previously seen  LVOT:                  Appears normal  Other:  Female gender. Technically difficult due to fetal position. ---------------------------------------------------------------------- Cervix Uterus Adnexa  Cervix  Length:            1.1  cm.  Appears funnelled, see comments. ---------------------------------------------------------------------- Impression  SIUP at 27+6 weeks  Normal interval anatomy; anatomic survey complete  Normal amniotic fluid volume  Appropriate interval growth with EFW at the 60th %tile  EV views  of cervix: funneling with distal closed portion  measuring 1.1 cms ---------------------------------------------------------------------- Recommendations  Continue 17P and vaginal progesterone  Decrease activity level  Follow cervix with cervical exam if indicated ----------------------------------------------------------------------                 Particia Nearing, MD Electronically Signed Final Report   11/08/2016 06:18 pm ----------------------------------------------------------------------   MAU Course: Orders Placed This  Encounter  Procedures  . Urinalysis, Routine w reflex microscopic  . Diet - low sodium heart healthy  . Ice to affected area  . Increase activity slowly  . Discharge patient Discharge disposition: 01-Home or Self Care; Discharge patient date: 11/15/2016   No orders of the defined types were placed in this encounter.   Assessment & Plan: 1. False labor    Pt with h/o contractions at home now resolved. Fetal strip reassuring and flat for contractions. Speculum exam performed with identification of closed cervical os.  --F/u OB on 3/20 --Labor precautions and fetal kick counts reviewed --Discharge home in stable condition  Follow-up Information    Scheryl Darter, MD. Go on 11/21/2016.   Specialty:  Obstetrics and Gynecology Why:  Go to appt at 3:15 PM. Contact information: [address] [place] [address] [phone]           Allergies as of 11/15/2016      Reactions   Neosporin [neomycin-bacitracin Zn-polymyx] Rash      Medication List    TAKE these medications   accu-chek multiclix lancets Check blood glucose levels 4 times daily   docusate sodium 100 MG capsule Commonly known as:  COLACE Take 100 mg by mouth 2 (two) times daily.   glucose blood test strip Commonly known as:  ACCU-CHEK GUIDE Use as instructed   polyethylene glycol packet Commonly known as:  MIRALAX / GLYCOLAX Take 17 g by mouth daily.   prenatal multivitamin Tabs tablet Take 1 tablet by mouth daily.   progesterone 200 MG capsule Commonly known as:  PROMETRIUM Place one capsule vaginally at bedtime       Wendee Beavers, DO, PGY-1 11/15/2016, 2:20 AM   I spoke with and examined patient and agree with resident/PA/SNM's note and plan of care.  [redacted]w[redacted]d pregnant w/ short sx and h/o 27wk PTB, currently on weekly 17P Was feeling contractions prior to arrival, but have completely resolved. Denies uti s/s, vag discharge/odor/itching/irritation.  Spec exam: cx visually closed, normal  d/c D/C home, reviewed ptl s/s, fkc. To increase po fluids.  Continue weekly 17P, keep appt as scheduled next Tues Cheral Marker, CNM, [facility] 11/15/2016 3:35 AM

## 2016-11-20 MED FILL — Betamethasone Sod Phosphate & Acetate Inj Susp 6 (3-3) MG/ML: INTRAMUSCULAR | Qty: 2 | Status: AC

## 2016-11-21 ENCOUNTER — Ambulatory Visit (INDEPENDENT_AMBULATORY_CARE_PROVIDER_SITE_OTHER): Payer: Medicaid Other | Admitting: Obstetrics & Gynecology

## 2016-11-21 DIAGNOSIS — Z23 Encounter for immunization: Secondary | ICD-10-CM

## 2016-11-21 DIAGNOSIS — O24419 Gestational diabetes mellitus in pregnancy, unspecified control: Secondary | ICD-10-CM

## 2016-11-21 HISTORY — DX: Gestational diabetes mellitus in pregnancy, unspecified control: O24.419

## 2016-11-21 NOTE — Progress Notes (Signed)
   PRENATAL VISIT NOTE  Subjective:  Kim Robertson is a 36 y.o. G3P1102 at 6225w5d being seen today for ongoing prenatal care.  She is currently monitored for the following issues for this high-risk pregnancy and has History of preterm delivery, currently pregnant in second trimester; Supervision of high-risk pregnancy; Cervical shortening; and Gestational diabetes mellitus (GDM), antepartum on her problem list.  Patient reports no complaints.  Contractions: Not present. Vag. Bleeding: None.  Movement: Present. Denies leaking of fluid.   The following portions of the patient's history were reviewed and updated as appropriate: allergies, current medications, past family history, past medical history, past social history, past surgical history and problem list. Problem list updated.  Objective:   Vitals:   11/21/16 1549  BP: 115/68  Pulse: 88  Weight: 198 lb 14.4 oz (90.2 kg)    Fetal Status: Fetal Heart Rate (bpm): 142   Movement: Present     General:  Alert, oriented and cooperative. Patient is in no acute distress.  Skin: Skin is warm and dry. No rash noted.   Cardiovascular: Normal heart rate noted  Respiratory: Normal respiratory effort, no problems with respiration noted  Abdomen: Soft, gravid, appropriate for gestational age. Pain/Pressure: Present     Pelvic:  Cervical exam deferred        Extremities: Normal range of motion.  Edema: None  Mental Status: Normal mood and affect. Normal behavior. Normal judgment and thought content.   Assessment and Plan:  Pregnancy: G3P1102 at 6525w5d  1. Gestational diabetes mellitus (GDM), antepartum, gestational diabetes method of control unspecified Needs instructions - Referral to Nutrition and Diabetes Services  Preterm labor symptoms and general obstetric precautions including but not limited to vaginal bleeding, contractions, leaking of fluid and fetal movement were reviewed in detail with the patient. Please refer to After Visit  Summary for other counseling recommendations.  Return in about 1 week (around 11/28/2016).   Adam PhenixJames G Arnold, MD

## 2016-11-21 NOTE — Patient Instructions (Signed)
Gestational Diabetes Mellitus, Self Care When you have gestational diabetes (gestational diabetes mellitus), you must keep your blood sugar (glucose) under control. You can do this with:  Nutrition.  Exercise.  Lifestyle changes.  Medicines or insulin, if needed.  Support from your doctors and others. How do I manage my blood sugar?  Check your blood sugar every day during pregnancy. Check it as often as told.  Call your doctor if your blood sugar is above your goal numbers for 2 tests in a row. Your doctor will set treatment goals for you. Try to have these blood sugars:  After not eating for a long time (after fasting): at or below 95 mg/dL (5.3 mmol/L).  After meals (postprandial):  One hour after a meal: at or below 140 mg/dL (7.8 mmol/L).  Two hours after a meal: at or below 120 mg/dL (6.7 mmol/L).  A1c (hemoglobin A1c) level: 6-6.5%. What do I need to know about high blood sugar? High blood sugar is called hyperglycemia. Know the early signs of high blood sugar. Signs include:  Feeling:  Thirsty.  Hungry.  Very tired.  Needing to pee (urinate) more than usual.  Blurry vision. What do I need to know about low blood sugar? Low blood sugar is called hypoglycemia. This is when blood sugar is at or below 70 mg/dL (3.9 mmol/L). Symptoms may include:  Feeling:  Hungry.  Worried or nervous (anxious).  Sweaty or clammy.  Confused.  Dizzy.  Sleepy.  Sick to your stomach (nauseous).  Having:  A fast heartbeat.  A headache.  A change in vision.  Jerky movements that you cannot control (seizure).  Nightmares.  Tingling or no feeling (numbness) around the mouth, lips, or tongue.  Having trouble with:  Talking.  Paying attention (concentrating).  Moving (coordination).  Sleeping.  Shaking.  Passing out (fainting).  Getting upset easily (irritability). Treating low blood sugar   To treat low blood sugar, eat or drink something sugary  right away. If you can think clearly and swallow safely, follow the 15:15 rule:  Take 15 grams of a fast-acting carb (carbohydrate). Some fast-acting carbs are:  1 tube of glucose gel.  3 sugar tablets (glucose pills).  6-8 pieces of hard candy.  4 oz (120 mL) of fruit juice.  4 oz (120 mL) regular (not diet) soda.  Check your blood sugar 15 minutes after you take the carb.  If your blood sugar is still at or below 70 mg/dL (3.9 mmol/L), take 15 grams of a carb again.  If your blood sugar does not go above 70 mg/dL (3.9 mmol/L) after 3 tries, get help right away.  After your blood sugar goes back to normal, eat a meal or a snack within 1 hour. Treating very low blood sugar  If your blood sugar is at or below 54 mg/dL (3 mmol/L), you have very low blood sugar (severe hypoglycemia). This is an emergency. Do not wait to see if the symptoms will go away. Get medical help right away. Call your local emergency services (911 in the [place] Do not drive yourself to the hospital. If you have very low blood sugar and you cannot eat or drink, you may need a glucagon shot (injection). A family member or friend should learn:  How to check your blood sugar.  How to give you a glucagon shot. Ask your doctor if you need a glucagon shot kit at home. What else is important to manage my diabetes? Medicine   Take your insulin  and diabetes medicines as told.  Do not run out of insulin or medicines.  Adjust your insulin and diabetes medicines as told. Food    Make healthy food choices. These include:  Chicken, fish, egg whites, and beans.  Oats, whole wheat, bulgur, brown rice, quinoa, and millet.  Fresh fruits and vegetables.  Low-fat dairy products.  Nuts, avocado, olive oil, and canola oil.  Make an eating plan. A food specialist (dietitian) can help you.  Follow instructions from your doctor about what you cannot eat or drink.  Drink enough fluid to keep your pee (urine) clear or  pale yellow.  Eat healthy snacks between healthy meals.  Keep track of carbs you eat. Read food labels. Learn about food serving sizes.  Follow your sick day plan when you cannot eat or drink normally. Make this plan with your doctor. Activity   Exercise 30 minutes or more a day or as much as told by your doctor.  Talk with your doctor if you start a new exercise. Your doctor may need to adjust your insulin, medicines, or food. Lifestyle   Do not drink alcohol.  Do not use any tobacco products. If you need help quitting, ask your doctor.  Learn how to deal with stress. If you need help with this, ask your doctor. Body care    Stay up to date with your shots (immunizations).  Brush your teeth and gums two times a day. Floss at least one time a day.  Go to the dentist least one time every 6 months.  Stay at a healthy weight while you are pregnant. General instructions   Take over-the-counter and prescription medicines only as told by your doctor.  Ask your doctor about risks of high blood pressure in pregnancy. These are called preeclampsia and eclampsia.  Share your diabetes care plan with:  Your work or school.  People you live with.  Check your pee for ketones:  When you are sick.  As told by your doctor.  Ask your doctor:  Do I need to meet with a diabetes educator?  Where can I find a support group for people with diabetes?  Carry a card or wear jewelry that says that you have diabetes.  Keep all follow-up visits with your doctor. This is important. Care after giving birth    Have your blood sugar checked 4-12 weeks after you give birth.  Get checked for diabetes at least every 3 years. Where to find more information: To learn more about diabetes, visit:  American Diabetes Association: www.diabetes.org/diabetes-basics/gestational  Centers for Disease Control and Prevention (CDC): http://sanchez-watson.com/.pdf This  information is not intended to replace advice given to you by your health care provider. Make sure you discuss any questions you have with your health care provider. Document Released: 12/13/2015 Document Revised: 01/27/2016 Document Reviewed: 09/24/2015 Elsevier Interactive Patient Education  2017 [employer] Postpartum Tubal Ligation Postpartum tubal ligation (PPTL) is a procedure to close the fallopian tubes. This is done so that you cannot get pregnant. When the fallopian tubes are closed, the eggs that the ovaries release cannot enter the uterus, and sperm cannot reach the eggs. PPTL is done right after childbirth or 1-2 days after childbirth, before the uterus returns to its normal location. PPTL is sometimes called "getting your tubes tied." You should not have this procedure if you want to get pregnant someday or if you are unsure about having more children. Tell a health care provider about:  Any allergies you have.  All medicines you are taking, including vitamins, herbs, eye drops, creams, and over-the-counter medicines.  Previous problems you or members of your family have had with the use of anesthetics.  Any blood disorders you have.  Previous surgeries you have had.  Any medical conditions you may have.  Any past pregnancies. What are the risks? Generally, this is a safe procedure. However, problems may occur, including:  Infection.  Bleeding.  Injury to surrounding organs.  Side effects from anesthetics.  Failure of the procedure. This procedure can increase your risk of a kind of pregnancy in which a fertilized egg attaches to the outside of the uterus (ectopic pregnancy). What happens before the procedure?  Ask your health care provider about:  How much pain you can expect to have.  What medicines you will be given for pain, especially if you are planning to breastfeed.  Follow instructions from your health care provider about eating and drinking  restrictions. What happens during the procedure? If you had a vaginal delivery:  You may be given one or more of the following:  A medicine that helps you relax (sedative).  A medicine to numb the area (local anesthetic).  A medicine to make you fall asleep (general anesthetic).  A medicine that is injected into an area of your body to numb everything below the injection site (regional anesthetic).  If you have been given a general anesthetic, a tube will be put down your throat to help you breathe.  An IV tube will be inserted into one of your veins to give you medicines and fluids during the procedure.  Your bladder may be emptied with a small tube (catheter).  An incision will be made just below your belly button.  Your fallopian tubes will be located and brought up through the incision.  Your fallopian tubes will be tied off, burned (cauterized), or blocked with a clip, ring, or clamp. A small portion in the center of each fallopian tube may be removed.  The incision will be closed with stitches (sutures).  A bandage (dressing) will be placed over the incision. If you had a cesarean delivery:  Tubal ligation will be done through the incision that was used for the cesarean delivery of your baby.  The incision will be closed with sutures.  A dressing will be placed over the incision. The procedure may vary among health care providers and hospitals. What happens after the procedure?  Your blood pressure, heart rate, breathing rate, and blood oxygen level will be monitored often until the medicines you were given have worn off.  You will be given pain medicine as needed.  Do not drive for 24 hours if you received a sedative. This information is not intended to replace advice given to you by your health care provider. Make sure you discuss any questions you have with your health care provider. Document Released: 08/21/2005 Document Revised: 01/24/2016 Document Reviewed:  08/01/2015 Elsevier Interactive Patient Education  2017 [employer].

## 2016-11-21 NOTE — Progress Notes (Signed)
17P Injection given in [facility]. Tolerated well.  Administrations This Visit    hydroxyprogesterone caproate (MAKENA) 250 mg/mL injection 250 mg    Admin Date 11/21/2016 Action Given Dose 250 mg Route Intramuscular Administered By Maretta Beesarol J Wyeth Hoffer, RMA

## 2016-11-28 ENCOUNTER — Encounter: Payer: Medicaid Other | Admitting: Obstetrics & Gynecology

## 2016-11-28 ENCOUNTER — Telehealth: Payer: Self-pay | Admitting: *Deleted

## 2016-11-28 NOTE — Telephone Encounter (Signed)
Appointment given.

## 2016-11-28 NOTE — Telephone Encounter (Signed)
Patient called to say she wants to change her appointment from tomorrow- she just doesn't feel good and can't make it. She wants to come today. 1:43 Called her back- left message- we have no open slots and advised she keep that appointment.

## 2016-12-06 ENCOUNTER — Encounter: Payer: Medicaid Other | Attending: Obstetrics & Gynecology | Admitting: *Deleted

## 2016-12-06 DIAGNOSIS — Z3A00 Weeks of gestation of pregnancy not specified: Secondary | ICD-10-CM | POA: Diagnosis not present

## 2016-12-06 DIAGNOSIS — O24410 Gestational diabetes mellitus in pregnancy, diet controlled: Secondary | ICD-10-CM | POA: Diagnosis not present

## 2016-12-06 NOTE — Progress Notes (Signed)
  Patient was seen on 12/06/2016 for Gestational Diabetes self-management . Patient had 2 children over the last 4 years with no history of GDM. She states her mother had GDM but no other history in her family. The following learning objectives were met by the patient :   States the definition of Gestational Diabetes  States why dietary management is important in controlling blood glucose  Describes the effects of carbohydrates on blood glucose levels  Demonstrates ability to create a balanced meal plan  Demonstrates carbohydrate counting   States when to check blood glucose levels  Demonstrates proper blood glucose monitoring techniques  States the effect of stress and exercise on blood glucose levels  States the importance of limiting caffeine and abstaining from alcohol and smoking  Plan:  Aim for 3 Carb Choices per meal (45 grams) +/- 1 either way  Aim for 1-2 Carbs per snack Begin reading food labels for Total Carbohydrate of foods Consider  increasing your activity level by walking or other activity daily as tolerated Begin checking BG before breakfast and 2 hours after first bite of breakfast, lunch and dinner as directed by MD  Take medication if directed by MD  Patient already has a meter: Accu Chek Guide And is testing pre breakfast and 1 hours each meal as directed by MD Patient reports : FBG range of 90-97 and 1 hour post meal all under 135 mg/dl  Patient instructed to monitor glucose levels: FBS: 60 - <90 1 hour: <140  Patient received the following handouts:  Nutrition Diabetes and Pregnancy  Carbohydrate Counting List  Patient will be seen for follow-up as needed.

## 2016-12-07 ENCOUNTER — Ambulatory Visit (INDEPENDENT_AMBULATORY_CARE_PROVIDER_SITE_OTHER): Payer: Medicaid Other | Admitting: Obstetrics & Gynecology

## 2016-12-07 VITALS — BP 127/71 | HR 89 | Wt 199.0 lb

## 2016-12-07 DIAGNOSIS — O24410 Gestational diabetes mellitus in pregnancy, diet controlled: Secondary | ICD-10-CM | POA: Diagnosis not present

## 2016-12-07 DIAGNOSIS — O0993 Supervision of high risk pregnancy, unspecified, third trimester: Secondary | ICD-10-CM

## 2016-12-07 NOTE — Patient Instructions (Signed)
Gestational Diabetes Mellitus, Diagnosis Gestational diabetes (gestational diabetes mellitus) is a short-term (temporary) form of diabetes that can happen during pregnancy. It goes away after you give birth. It may be caused by one or both of these problems:  Your body does not make enough of a hormone called insulin.  Your body does not respond in a normal way to insulin that it makes. Insulin lets sugars (glucose) go into cells in the body. This gives you energy. If you have diabetes, sugars cannot get into cells. This causes high blood sugar (hyperglycemia). If diabetes is treated, it may not hurt you or your baby. Your doctor will set treatment goals for you. In general, you should have these blood sugar levels:  After not eating for a long time (fasting): 95 mg/dL (5.3 mmol/L).  After meals (postprandial):  One hour after a meal: at or below 140 mg/dL (7.8 mmol/L).  Two hours after a meal: at or below 120 mg/dL (6.7 mmol/L).  A1c (hemoglobin A1c) level: 6-6.5%. Follow these instructions at home: Questions to Ask Your Doctor  You may want to ask these questions:  Do I need to meet with a diabetes educator?  Where can I find a support group for people with diabetes?  What equipment will I need to care for myself at home?  What diabetes medicines do I need? When should I take them?  How often do I need to check my blood sugar?  What number can I call if I have questions?  When is my next doctor's visit? General instructions  Take over-the-counter and prescription medicines only as told by your doctor.  Stay at a healthy weight during pregnancy.  Keep all follow-up visits as told by your doctor. This is important. Contact a doctor if:  Your blood sugar is at or above 240 mg/dL (13.3 mmol/L).  Your blood sugar is at or above 200 mg/dL (11.1 mmol/L) and you have ketones in your pee (urine).  You have been sick or have had a fever for 2 days or more and you are not  getting better.  You have any of these problems for more than 6 hours:  You cannot eat or drink.  You feel sick to your stomach (nauseous).  You throw up (vomit).  You have watery poop (diarrhea). Get help right away if:  Your blood sugar is lower than 54 mg/dL (3 mmol/L).  You get confused.  You have trouble:  Thinking clearly.  Breathing.  Your baby moves less than normal.  You have:  Moderate or large ketone levels in your pee (urine).  Bleeding from your vagina.  Unusual fluid coming from your vagina.  Early contractions. These may feel like tightness in your belly. This information is not intended to replace advice given to you by your health care provider. Make sure you discuss any questions you have with your health care provider. Document Released: 12/13/2015 Document Revised: 01/27/2016 Document Reviewed: 09/24/2015 Elsevier Interactive Patient Education  2017 Elsevier Inc.  

## 2016-12-07 NOTE — Progress Notes (Signed)
Pt presents for ROB. Pt had visit with N&D yesterday. She does not have CBG readings today. 17P given R upper outer quad w/o difficulty.

## 2016-12-07 NOTE — Progress Notes (Signed)
   PRENATAL VISIT NOTE  Subjective:  Kim Robertson is a 36 y.o. G3P1102 at [redacted]w[redacted]d being seen today for ongoing prenatal care.  She is currently monitored for the following issues for this high-risk pregnancy and has History of preterm delivery, currently pregnant in third trimester; Supervision of high-risk pregnancy; Cervical shortening; and Gestational diabetes mellitus (GDM), antepartum on her problem list.  Patient reports no complaints.  Contractions: Not present. Vag. Bleeding: None.  Movement: Present. Denies leaking of fluid.   The following portions of the patient's history were reviewed and updated as appropriate: allergies, current medications, past family history, past medical history, past social history, past surgical history and problem list. Problem list updated.  Objective:   Vitals:   12/07/16 1308  BP: 127/71  Pulse: 89  Weight: 199 lb (90.3 kg)    Fetal Status: Fetal Heart Rate (bpm): 155   Movement: Present     General:  Alert, oriented and cooperative. Patient is in no acute distress.  Skin: Skin is warm and dry. No rash noted.   Cardiovascular: Normal heart rate noted  Respiratory: Normal respiratory effort, no problems with respiration noted  Abdomen: Soft, gravid, appropriate for gestational age. Pain/Pressure: Present     Pelvic:  Cervical exam deferred        Extremities: Normal range of motion.  Edema: None  Mental Status: Normal mood and affect. Normal behavior. Normal judgment and thought content.   Assessment and Plan:  Pregnancy: G3P1102 at [redacted]w[redacted]d  1. Diet controlled gestational diabetes mellitus (GDM), antepartum She has started BG testing and states FBS 100 and PP 130 but did not bring her meter or record. RTC with values to review  2. Supervision of high risk pregnancy in third trimester 59 P for h/o PTB  Preterm labor symptoms and general obstetric precautions including but not limited to vaginal bleeding, contractions, leaking of fluid and  fetal movement were reviewed in detail with the patient. Please refer to After Visit Summary for other counseling recommendations.  Return in about 1 week (around 12/14/2016).   Adam Phenix, MD

## 2016-12-12 ENCOUNTER — Ambulatory Visit (INDEPENDENT_AMBULATORY_CARE_PROVIDER_SITE_OTHER): Payer: Medicaid Other | Admitting: Obstetrics and Gynecology

## 2016-12-12 VITALS — BP 107/64 | HR 85 | Wt 200.0 lb

## 2016-12-12 DIAGNOSIS — O24410 Gestational diabetes mellitus in pregnancy, diet controlled: Secondary | ICD-10-CM

## 2016-12-12 DIAGNOSIS — O09893 Supervision of other high risk pregnancies, third trimester: Secondary | ICD-10-CM

## 2016-12-12 DIAGNOSIS — O26873 Cervical shortening, third trimester: Secondary | ICD-10-CM

## 2016-12-12 DIAGNOSIS — O09213 Supervision of pregnancy with history of pre-term labor, third trimester: Secondary | ICD-10-CM

## 2016-12-12 DIAGNOSIS — O0993 Supervision of high risk pregnancy, unspecified, third trimester: Secondary | ICD-10-CM

## 2016-12-12 DIAGNOSIS — O26872 Cervical shortening, second trimester: Secondary | ICD-10-CM

## 2016-12-12 MED ORDER — PROGESTERONE MICRONIZED 200 MG PO CAPS: ORAL_CAPSULE | ORAL | 3 refills | Status: DC

## 2016-12-12 NOTE — Progress Notes (Signed)
Subjective:  Kim Robertson is a 36 y.o. G3P1102 at [redacted]w[redacted]d being seen today for ongoing prenatal care.  She is currently monitored for the following issues for this high-risk pregnancy and has History of preterm delivery, currently pregnant in third trimester; Supervision of high-risk pregnancy; Cervical shortening; and Gestational diabetes mellitus (GDM), antepartum on her problem list.  Patient reports no complaints.  Contractions: Not present. Vag. Bleeding: None.  Movement: Present. Denies leaking of fluid.   The following portions of the patient's history were reviewed and updated as appropriate: allergies, current medications, past family history, past medical history, past social history, past surgical history and problem list. Problem list updated.  Objective:   Vitals:   12/12/16 1011  BP: 107/64  Pulse: 85  Weight: 200 lb (90.7 kg)    Fetal Status: Fetal Heart Rate (bpm): 150   Movement: Present     General:  Alert, oriented and cooperative. Patient is in no acute distress.  Skin: Skin is warm and dry. No rash noted.   Cardiovascular: Normal heart rate noted  Respiratory: Normal respiratory effort, no problems with respiration noted  Abdomen: Soft, gravid, appropriate for gestational age. Pain/Pressure: Present     Pelvic:  Cervical exam deferred        Extremities: Normal range of motion.  Edema: None  Mental Status: Normal mood and affect. Normal behavior. Normal judgment and thought content.   Urinalysis:      Assessment and Plan:  Pregnancy: G3P1102 at [redacted]w[redacted]d  1. Supervision of high risk pregnancy in third trimester Stable  2. Cervical shortening in third trimester Continue with 17 OHP Pt ran out of Prometrium Rx refilled  3. History of preterm delivery, currently pregnant in third trimester As noted abve  4. Diet controlled gestational diabetes mellitus (GDM), antepartum BS stable Pt has been check 1 hr after meals Instructed to check 2 hours after  meals  - [place] MFM OB FOLLOW UP; Future  5. Cervical shortening in second trimester Continue with 17-OHP - progesterone (PROMETRIUM) 200 MG capsule; Place one capsule vaginally at bedtime  Dispense: 30 capsule; Refill: 3  Preterm labor symptoms and general obstetric precautions including but not limited to vaginal bleeding, contractions, leaking of fluid and fetal movement were reviewed in detail with the patient. Please refer to After Visit Summary for other counseling recommendations.  Return in about 1 week (around 12/19/2016) for OB visit.   Hermina Staggers, MD

## 2016-12-12 NOTE — Progress Notes (Signed)
Pt presents for ROB. 17p given L upper outer quad w/o difficulty. CBG log available today. Unable to void; will try prior to leaving.

## 2016-12-17 ENCOUNTER — Inpatient Hospital Stay (HOSPITAL_COMMUNITY)
Admission: AD | Admit: 2016-12-17 | Discharge: 2016-12-17 | Disposition: A | Discharge status: Home / Self Care | Payer: Medicaid Other | Source: Ambulatory Visit | Attending: Family Medicine | Admitting: Family Medicine

## 2016-12-17 ENCOUNTER — Encounter (HOSPITAL_COMMUNITY): Payer: Self-pay | Admitting: Certified Nurse Midwife

## 2016-12-17 DIAGNOSIS — Z888 Allergy status to other drugs, medicaments and biological substances status: Secondary | ICD-10-CM | POA: Insufficient documentation

## 2016-12-17 DIAGNOSIS — O9989 Other specified diseases and conditions complicating pregnancy, childbirth and the puerperium: Secondary | ICD-10-CM | POA: Diagnosis not present

## 2016-12-17 DIAGNOSIS — Z8751 Personal history of pre-term labor: Secondary | ICD-10-CM | POA: Insufficient documentation

## 2016-12-17 DIAGNOSIS — Z3A33 33 weeks gestation of pregnancy: Secondary | ICD-10-CM | POA: Diagnosis not present

## 2016-12-17 DIAGNOSIS — R339 Retention of urine, unspecified: Secondary | ICD-10-CM | POA: Diagnosis present

## 2016-12-17 DIAGNOSIS — O99613 Diseases of the digestive system complicating pregnancy, third trimester: Secondary | ICD-10-CM | POA: Insufficient documentation

## 2016-12-17 DIAGNOSIS — K5900 Constipation, unspecified: Secondary | ICD-10-CM | POA: Insufficient documentation

## 2016-12-17 MED ORDER — DOCUSATE SODIUM 100 MG PO CAPS: 100.0000 mg | ORAL_CAPSULE | Freq: Two times a day (BID) | ORAL | 0 refills | Status: DC

## 2016-12-17 NOTE — MAU Provider Note (Signed)
History     CSN: [id number]  Arrival date and time: 12/17/16 7829   First Provider Initiated Contact with Patient 12/17/16 2026      Chief Complaint  Patient presents with  . Constipation  . Urinary Retention   Kim Robertson is a 36 y.o. F6O1308 at [redacted]w[redacted]d who presents today with constipation. She states that she has not had a BM x 5 days. She is also having trouble urinating. She denies any VB or LOF. She reports normal fetal movement.    Constipation  This is a new problem. The current episode started in the past 7 days. The problem is unchanged. Her stool frequency is 1 time per week or less. The stool is described as formed. The patient is on a high fiber diet. She does not exercise regularly. There has not been adequate water intake. Associated symptoms include difficulty urinating. Pertinent negatives include no fever, nausea or vomiting. Risk factors: pregnancy  She has tried nothing for the symptoms.   Past Medical History:  Diagnosis Date  . Preterm labor     Past Surgical History:  Procedure Laterality Date  . NO PAST SURGERIES      History reviewed. No pertinent family history.  Social History  Substance Use Topics  . Smoking status: Never Smoker  . Smokeless tobacco: Never Used  . Alcohol use No    Allergies:  Allergies  Allergen Reactions  . Neosporin [Neomycin-Bacitracin Zn-Polymyx] Rash    Facility-Administered Medications Prior to Admission  Medication Dose Route Frequency Provider Last Rate Last Dose  . hydroxyprogesterone caproate (MAKENA) 250 mg/mL injection 250 mg  250 mg Intramuscular Weekly Hermina Staggers, MD   250 mg at 12/12/16 1021   Prescriptions Prior to Admission  Medication Sig Dispense Refill Last Dose  . Prenatal Vit-Fe Fumarate-FA (PRENATAL MULTIVITAMIN) TABS tablet Take 1 tablet by mouth daily.   12/16/2016 at Unknown time  . acetaminophen (TYLENOL) 325 MG tablet Take 650 mg by mouth every 6 (six) hours as needed.   Taking  .  docusate sodium (COLACE) 100 MG capsule Take 100 mg by mouth 2 (two) times daily.   Taking  . glucose blood (ACCU-CHEK GUIDE) test strip Use as instructed 100 each 5 Taking  . Lancets (ACCU-CHEK MULTICLIX) lancets Check blood glucose levels 4 times daily 100 each 5 Taking  . polyethylene glycol (MIRALAX / GLYCOLAX) packet Take 17 g by mouth daily.   Taking  . progesterone (PROMETRIUM) 200 MG capsule Place one capsule vaginally at bedtime 30 capsule 3     Review of Systems  Constitutional: Negative for chills and fever.  Gastrointestinal: Positive for constipation. Negative for nausea and vomiting.  Genitourinary: Positive for difficulty urinating. Negative for vaginal bleeding and vaginal discharge.   Physical Exam   Blood pressure (!) 118/59, pulse 77, temperature 97.6 F (36.4 C), temperature source Oral, resp. rate 18, height  (1.651 m), weight 92.5 kg (204 lb), last menstrual period 06/04/2016, SpO2 100 %, unknown if currently breastfeeding.  Physical Exam  Nursing note and vitals reviewed. Constitutional: She is oriented to person, place, and time. She appears well-developed and well-nourished. No distress.  HENT:  Head: Normocephalic.  Cardiovascular: Normal rate.   Respiratory: Effort normal.  GI: Soft. There is no tenderness. There is no rebound.  Genitourinary:  Genitourinary Comments: Cervix: FT/THICK/Ballotable   Neurological: She is alert and oriented to person, place, and time.  Skin: Skin is warm and dry.  Psychiatric: She has a normal mood and affect.  FHT: 135, moderate with 15x15 accels, no decels Toco: no UCs    MAU Course  Procedures  MDM Patient has had soap suds enema. She reports +BM and +urination   Assessment and Plan   1. Constipation, unspecified constipation type   2. [redacted] weeks gestation of pregnancy    DC home Comfort measures reviewed  3rd Trimester precautions  PTL precautions  Fetal kick counts RX: colace BID #[id number]  Return to MAU as  needed FU with OB as planned  Follow-up Information    HARPER,CHARLES A, MD Follow up.   Specialty:  Obstetrics and Gynecology Contact information: [address] [place] [address] [phone]            Tawnya Crook 12/17/2016, 8:39 PM

## 2016-12-17 NOTE — Discharge Instructions (Signed)
Constipation, Adult °Constipation is when a person: °· Poops (has a bowel movement) fewer times in a week than normal. °· Has a hard time pooping. °· Has poop that is dry, hard, or bigger than normal. ° °Follow these instructions at home: °Eating and drinking ° °· Eat foods that have a lot of fiber, such as: °? Fresh fruits and vegetables. °? Whole grains. °? Beans. °· Eat less of foods that are high in fat, low in fiber, or overly processed, such as: °? French fries. °? Hamburgers. °? Cookies. °? Candy. °? Soda. °· Drink enough fluid to keep your pee (urine) clear or pale yellow. °General instructions °· Exercise regularly or as told by your doctor. °· Go to the restroom when you feel like you need to poop. Do not hold it in. °· Take over-the-counter and prescription medicines only as told by your doctor. These include any fiber supplements. °· Do pelvic floor retraining exercises, such as: °? Doing deep breathing while relaxing your lower belly (abdomen). °? Relaxing your pelvic floor while pooping. °· Watch your condition for any changes. °· Keep all follow-up visits as told by your doctor. This is important. °Contact a doctor if: °· You have pain that gets worse. °· You have a fever. °· You have not pooped for 4 days. °· You throw up (vomit). °· You are not hungry. °· You lose weight. °· You are bleeding from the anus. °· You have thin, pencil-like poop (stool). °Get help right away if: °· You have a fever, and your symptoms suddenly get worse. °· You leak poop or have blood in your poop. °· Your belly feels hard or bigger than normal (is bloated). °· You have very bad belly pain. °· You feel dizzy or you faint. °This information is not intended to replace advice given to you by your health care provider. Make sure you discuss any questions you have with your health care provider. °Document Released: 02/07/2008 Document Revised: 03/10/2016 Document Reviewed: 02/09/2016 °Elsevier Interactive Patient Education ©  2017 Else[employer] Inc. ° °

## 2016-12-17 NOTE — MAU Note (Signed)
Pt c/o of constipation and inability to urinate. Pt states last BM was 5 days ago. Has not tried stool softeners, laxatives, etc, but has tried to increase water intake and fiber intake. States the inability to urinate started this morning. Called Dr Modena Jansky and was told to come in. Pt denies pain, vaginal bleeding, LOF. +FM

## 2016-12-19 ENCOUNTER — Ambulatory Visit (INDEPENDENT_AMBULATORY_CARE_PROVIDER_SITE_OTHER): Payer: Medicaid Other | Admitting: Obstetrics and Gynecology

## 2016-12-19 VITALS — BP 117/67 | HR 75 | Wt 198.6 lb

## 2016-12-19 DIAGNOSIS — O09213 Supervision of pregnancy with history of pre-term labor, third trimester: Secondary | ICD-10-CM | POA: Diagnosis not present

## 2016-12-19 DIAGNOSIS — O24410 Gestational diabetes mellitus in pregnancy, diet controlled: Secondary | ICD-10-CM

## 2016-12-19 DIAGNOSIS — O09893 Supervision of other high risk pregnancies, third trimester: Secondary | ICD-10-CM

## 2016-12-19 DIAGNOSIS — O0993 Supervision of high risk pregnancy, unspecified, third trimester: Secondary | ICD-10-CM

## 2016-12-19 DIAGNOSIS — O26873 Cervical shortening, third trimester: Secondary | ICD-10-CM

## 2016-12-19 NOTE — Progress Notes (Signed)
Patient reports she has thick mucus discharge at times, some pressure at times and some swelling at times- nothing that is all the time. Patient reports she is doing well with her diet.

## 2016-12-19 NOTE — Progress Notes (Signed)
Subjective:  Kim Robertson is a 36 y.o. G3P1102 at [redacted]w[redacted]d being seen today for ongoing prenatal care.  She is currently monitored for the following issues for this high-risk pregnancy and has History of preterm delivery, currently pregnant in third trimester; Supervision of high-risk pregnancy; Cervical shortening; and Gestational diabetes mellitus (GDM), antepartum on her problem list.  Patient reports constipation is better with Colace. Some vaginal d/c at times but no bleedin. Cervix closed at recent MAU visit..  Contractions: Not present. Vag. Bleeding: None.  Movement: Present. Denies leaking of fluid.   The following portions of the patient's history were reviewed and updated as appropriate: allergies, current medications, past family history, past medical history, past social history, past surgical history and problem list. Problem list updated.  Objective:   Vitals:   12/19/16 1456  BP: 117/67  Pulse: 75  Weight: 198 lb 9.6 oz (90.1 kg)    Fetal Status: Fetal Heart Rate (bpm): 142   Movement: Present     General:  Alert, oriented and cooperative. Patient is in no acute distress.  Skin: Skin is warm and dry. No rash noted.   Cardiovascular: Normal heart rate noted  Respiratory: Normal respiratory effort, no problems with respiration noted  Abdomen: Soft, gravid, appropriate for gestational age. Pain/Pressure: Absent     Pelvic:  Cervical exam deferred        Extremities: Normal range of motion.  Edema: None  Mental Status: Normal mood and affect. Normal behavior. Normal judgment and thought content.   Urinalysis: Urine Protein: Trace Urine Glucose: Negative  Assessment and Plan:  Pregnancy: G3P1102 at [redacted]w[redacted]d  1. 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 Supervision of high risk pregnancy in third trimester Stable  3. History of preterm delivery, currently pregnant in third trimester PROM at 27 weeks   4. Diet controlled gestational  diabetes mellitus (GDM), antepartum BS in goal range Continue with diet  Preterm labor symptoms and general obstetric precautions including but not limited to vaginal bleeding, contractions, leaking of fluid and fetal movement were reviewed in detail with the patient. Please refer to After Visit Summary for other counseling recommendations.  Return in about 2 weeks (around 01/02/2017) for OB visit.   Hermina Staggers, MD

## 2016-12-19 NOTE — Patient Instructions (Signed)
Third Trimester of Pregnancy The third trimester is from week 28 through week 40 (months 7 through 9). The third trimester is a time when the unborn baby (fetus) is growing rapidly. At the end of the ninth month, the fetus is about 20 inches in length and weighs 6-10 pounds. Body changes during your third trimester Your body will continue to go through many changes during pregnancy. The changes vary from woman to woman. During the third trimester:  Your weight will continue to increase. You can expect to gain 25-35 pounds (11-16 kg) by the end of the pregnancy.  You may begin to get stretch marks on your hips, abdomen, and breasts.  You may urinate more often because the fetus is moving lower into your pelvis and pressing on your bladder.  You may develop or continue to have heartburn. This is caused by increased hormones that slow down muscles in the digestive tract.  You may develop or continue to have constipation because increased hormones slow digestion and cause the muscles that push waste through your intestines to relax.  You may develop hemorrhoids. These are swollen veins (varicose veins) in the rectum that can itch or be painful.  You may develop swollen, bulging veins (varicose veins) in your legs.  You may have increased body aches in the pelvis, back, or thighs. This is due to weight gain and increased hormones that are relaxing your joints.  You may have changes in your hair. These can include thickening of your hair, rapid growth, and changes in texture. Some women also have hair loss during or after pregnancy, or hair that feels dry or thin. Your hair will most likely return to normal after your baby is born.  Your breasts will continue to grow and they will continue to become tender. A yellow fluid (colostrum) may leak from your breasts. This is the first milk you are producing for your baby.  Your belly button may stick out.  You may notice more swelling in your hands,  face, or ankles.  You may have increased tingling or numbness in your hands, arms, and legs. The skin on your belly may also feel numb.  You may feel short of breath because of your expanding uterus.  You may have more problems sleeping. This can be caused by the size of your belly, increased need to urinate, and an increase in your body's metabolism.  You may notice the fetus "dropping," or moving lower in your abdomen (lightening).  You may have increased vaginal discharge.  You may notice your joints feel loose and you may have pain around your pelvic bone.  What to expect at prenatal visits You will have prenatal exams every 2 weeks until week 36. Then you will have weekly prenatal exams. During a routine prenatal visit:  You will be weighed to make sure you and the baby are growing normally.  Your blood pressure will be taken.  Your abdomen will be measured to track your baby's growth.  The fetal heartbeat will be listened to.  Any test results from the previous visit will be discussed.  You may have a cervical check near your due date to see if your cervix has softened or thinned (effaced).  You will be tested for Group B streptococcus. This happens between 35 and 37 weeks.  Your health care provider may ask you:  What your birth plan is.  How you are feeling.  If you are feeling the baby move.  If you have had   any abnormal symptoms, such as leaking fluid, bleeding, severe headaches, or abdominal cramping.  If you are using any tobacco products, including cigarettes, chewing tobacco, and electronic cigarettes.  If you have any questions.  Other tests or screenings that may be performed during your third trimester include:  Blood tests that check for low iron levels (anemia).  Fetal testing to check the health, activity level, and growth of the fetus. Testing is done if you have certain medical conditions or if there are problems during the  pregnancy.  Nonstress test (NST). This test checks the health of your baby to make sure there are no signs of problems, such as the baby not getting enough oxygen. During this test, a belt is placed around your belly. The baby is made to move, and its heart rate is monitored during movement.  What is false labor? False labor is a condition in which you feel small, irregular tightenings of the muscles in the womb (contractions) that usually go away with rest, changing position, or drinking water. These are called Braxton Hicks contractions. Contractions may last for hours, days, or even weeks before true labor sets in. If contractions come at regular intervals, become more frequent, increase in intensity, or become painful, you should see your health care provider. What are the signs of labor?  Abdominal cramps.  Regular contractions that start at 10 minutes apart and become stronger and more frequent with time.  Contractions that start on the top of the uterus and spread down to the lower abdomen and back.  Increased pelvic pressure and dull back pain.  A watery or bloody mucus discharge that comes from the vagina.  Leaking of amniotic fluid. This is also known as your "water breaking." It could be a slow trickle or a gush. Let your health care provider know if it has a color or strange odor. If you have any of these signs, call your health care provider right away, even if it is before your due date. Follow these instructions at home: Medicines  Follow your health care provider's instructions regarding medicine use. Specific medicines may be either safe or unsafe to take during pregnancy.  Take a prenatal vitamin that contains at least 600 micrograms (mcg) of folic acid.  If you develop constipation, try taking a stool softener if your health care provider approves. Eating and drinking  Eat a balanced diet that includes fresh fruits and vegetables, whole grains, good sources of protein  such as meat, eggs, or tofu, and low-fat dairy. Your health care provider will help you determine the amount of weight gain that is right for you.  Avoid raw meat and uncooked cheese. These carry germs that can cause birth defects in the baby.  If you have low calcium intake from food, talk to your health care provider about whether you should take a daily calcium supplement.  Eat four or five small meals rather than three large meals a day.  Limit foods that are high in fat and processed sugars, such as fried and sweet foods.  To prevent constipation: ? Drink enough fluid to keep your urine clear or pale yellow. ? Eat foods that are high in fiber, such as fresh fruits and vegetables, whole grains, and beans. Activity  Exercise only as directed by your health care provider. Most women can continue their usual exercise routine during pregnancy. Try to exercise for 30 minutes at least 5 days a week. Stop exercising if you experience uterine contractions.  Avoid heavy   lifting.  Do not exercise in extreme heat or humidity, or at high altitudes.  Wear low-heel, comfortable shoes.  Practice good posture.  You may continue to have sex unless your health care provider tells you otherwise. Relieving pain and discomfort  Take frequent breaks and rest with your legs elevated if you have leg cramps or low back pain.  Take warm sitz baths to soothe any pain or discomfort caused by hemorrhoids. Use hemorrhoid cream if your health care provider approves.  Wear a good support bra to prevent discomfort from breast tenderness.  If you develop varicose veins: ? Wear support pantyhose or compression stockings as told by your healthcare provider. ? Elevate your feet for 15 minutes, 3-4 times a day. Prenatal care  Write down your questions. Take them to your prenatal visits.  Keep all your prenatal visits as told by your health care provider. This is important. Safety  Wear your seat belt at  all times when driving.  Make a list of emergency phone numbers, including numbers for family, friends, the hospital, and police and fire departments. General instructions  Avoid cat litter boxes and soil used by cats. These carry germs that can cause birth defects in the baby. If you have a cat, ask someone to clean the litter box for you.  Do not travel far distances unless it is absolutely necessary and only with the approval of your health care provider.  Do not use hot tubs, steam rooms, or saunas.  Do not drink alcohol.  Do not use any products that contain nicotine or tobacco, such as cigarettes and e-cigarettes. If you need help quitting, ask your health care provider.  Do not use any medicinal herbs or unprescribed drugs. These chemicals affect the formation and growth of the baby.  Do not douche or use tampons or scented sanitary pads.  Do not cross your legs for long periods of time.  To prepare for the arrival of your baby: ? Take prenatal classes to understand, practice, and ask questions about labor and delivery. ? Make a trial run to the hospital. ? Visit the hospital and tour the maternity area. ? Arrange for maternity or paternity leave through employers. ? Arrange for family and friends to take care of pets while you are in the hospital. ? Purchase a rear-facing car seat and make sure you know how to install it in your car. ? Pack your hospital bag. ? Prepare the baby's nursery. Make sure to remove all pillows and stuffed animals from the baby's crib to prevent suffocation.  Visit your dentist if you have not gone during your pregnancy. Use a soft toothbrush to brush your teeth and be gentle when you floss. Contact a health care provider if:  You are unsure if you are in labor or if your water has broken.  You become dizzy.  You have mild pelvic cramps, pelvic pressure, or nagging pain in your abdominal area.  You have lower back pain.  You have persistent  nausea, vomiting, or diarrhea.  You have an unusual or bad smelling vaginal discharge.  You have pain when you urinate. Get help right away if:  Your water breaks before 37 weeks.  You have regular contractions less than 5 minutes apart before 37 weeks.  You have a fever.  You are leaking fluid from your vagina.  You have spotting or bleeding from your vagina.  You have severe abdominal pain or cramping.  You have rapid weight loss or weight gain.    You have shortness of breath with chest pain.  You notice sudden or extreme swelling of your face, hands, ankles, feet, or legs.  Your baby makes fewer than 10 movements in 2 hours.  You have severe headaches that do not go away when you take medicine.  You have vision changes. Summary  The third trimester is from week 28 through week 40, months 7 through 9. The third trimester is a time when the unborn baby (fetus) is growing rapidly.  During the third trimester, your discomfort may increase as you and your baby continue to gain weight. You may have abdominal, leg, and back pain, sleeping problems, and an increased need to urinate.  During the third trimester your breasts will keep growing and they will continue to become tender. A yellow fluid (colostrum) may leak from your breasts. This is the first milk you are producing for your baby.  False labor is a condition in which you feel small, irregular tightenings of the muscles in the womb (contractions) that eventually go away. These are called Braxton Hicks contractions. Contractions may last for hours, days, or even weeks before true labor sets in.  Signs of labor can include: abdominal cramps; regular contractions that start at 10 minutes apart and become stronger and more frequent with time; watery or bloody mucus discharge that comes from the vagina; increased pelvic pressure and dull back pain; and leaking of amniotic fluid. This information is not intended to replace advice  given to you by your health care provider. Make sure you discuss any questions you have with your health care provider. Document Released: 08/15/2001 Document Revised: 01/27/2016 Document Reviewed: 10/22/2012 Elsevier Interactive Patient Education  2017 [employer] Inc.  

## 2016-12-27 ENCOUNTER — Ambulatory Visit (INDEPENDENT_AMBULATORY_CARE_PROVIDER_SITE_OTHER): Payer: Medicaid Other

## 2016-12-27 VITALS — BP 114/74 | HR 86 | Wt 198.0 lb

## 2016-12-27 DIAGNOSIS — O09893 Supervision of other high risk pregnancies, third trimester: Secondary | ICD-10-CM

## 2016-12-27 DIAGNOSIS — O09213 Supervision of pregnancy with history of pre-term labor, third trimester: Secondary | ICD-10-CM

## 2016-12-27 DIAGNOSIS — O26873 Cervical shortening, third trimester: Secondary | ICD-10-CM

## 2016-12-27 NOTE — Progress Notes (Signed)
PATIENT TOLERATED 17 P WELL LUOQ

## 2017-01-01 ENCOUNTER — Encounter (HOSPITAL_COMMUNITY): Payer: Self-pay | Admitting: *Deleted

## 2017-01-01 ENCOUNTER — Inpatient Hospital Stay (HOSPITAL_COMMUNITY)
Admission: AD | Admit: 2017-01-01 | Discharge: 2017-01-01 | Disposition: A | Discharge status: Home / Self Care | Payer: Medicaid Other | Source: Ambulatory Visit | Attending: Family Medicine | Admitting: Family Medicine

## 2017-01-01 DIAGNOSIS — B9689 Other specified bacterial agents as the cause of diseases classified elsewhere: Secondary | ICD-10-CM | POA: Diagnosis not present

## 2017-01-01 DIAGNOSIS — B3731 Acute candidiasis of vulva and vagina: Secondary | ICD-10-CM

## 2017-01-01 DIAGNOSIS — Z888 Allergy status to other drugs, medicaments and biological substances status: Secondary | ICD-10-CM | POA: Insufficient documentation

## 2017-01-01 DIAGNOSIS — O09213 Supervision of pregnancy with history of pre-term labor, third trimester: Secondary | ICD-10-CM | POA: Diagnosis not present

## 2017-01-01 DIAGNOSIS — O23593 Infection of other part of genital tract in pregnancy, third trimester: Secondary | ICD-10-CM | POA: Diagnosis not present

## 2017-01-01 DIAGNOSIS — Z8751 Personal history of pre-term labor: Secondary | ICD-10-CM | POA: Diagnosis not present

## 2017-01-01 DIAGNOSIS — O9989 Other specified diseases and conditions complicating pregnancy, childbirth and the puerperium: Secondary | ICD-10-CM | POA: Diagnosis not present

## 2017-01-01 DIAGNOSIS — O368130 Decreased fetal movements, third trimester, not applicable or unspecified: Secondary | ICD-10-CM | POA: Insufficient documentation

## 2017-01-01 DIAGNOSIS — Z3A35 35 weeks gestation of pregnancy: Secondary | ICD-10-CM | POA: Diagnosis not present

## 2017-01-01 DIAGNOSIS — Z3689 Encounter for other specified antenatal screening: Secondary | ICD-10-CM

## 2017-01-01 DIAGNOSIS — B373 Candidiasis of vulva and vagina: Secondary | ICD-10-CM | POA: Diagnosis not present

## 2017-01-01 DIAGNOSIS — N898 Other specified noninflammatory disorders of vagina: Secondary | ICD-10-CM | POA: Diagnosis present

## 2017-01-01 LAB — URINALYSIS, ROUTINE W REFLEX MICROSCOPIC
Bilirubin Urine: NEGATIVE
Glucose, UA: NEGATIVE mg/dL
Hgb urine dipstick: NEGATIVE
KETONES UR: NEGATIVE mg/dL
Nitrite: NEGATIVE
PROTEIN: 100 mg/dL — AB
Specific Gravity, Urine: 1.031 — ABNORMAL HIGH (ref 1.005–1.030)
pH: 6 (ref 5.0–8.0)

## 2017-01-01 LAB — WET PREP, GENITAL
CLUE CELLS WET PREP: NONE SEEN
Sperm: NONE SEEN
TRICH WET PREP: NONE SEEN

## 2017-01-01 MED ORDER — TERCONAZOLE 0.4 % VA CREA: 1.0000 | TOPICAL_CREAM | Freq: Every day | VAGINAL | 0 refills | Status: DC

## 2017-01-01 NOTE — MAU Note (Signed)
Pt reports decreased fetal movement all day and has not felt the baby at all since this afternoon.  Has occasional ctx and denies any vag bleeding or leaking.

## 2017-01-01 NOTE — MAU Provider Note (Signed)
Patient Kim Robertson is a [id number] At [redacted]w[redacted]d  here with complaints of decreased movements since this afternoon. She also reports vaginal discharge and itching since Saturday (3 days ago).   History     CSN: [id number]  Arrival date and time: 01/01/17 9562   First Provider Initiated Contact with Patient 01/01/17 2009      No chief complaint on file.  Vaginal Discharge  The patient's primary symptoms include genital itching and vaginal discharge. The patient's pertinent negatives include no genital odor or pelvic pain. This is a new problem. The current episode started in the past 7 days. The problem occurs constantly. The problem has been unchanged. Associated symptoms include abdominal pain. The vaginal discharge was white and thick. There has been no bleeding. Nothing aggravates the symptoms. She has tried nothing for the symptoms.    OB History    Gravida Para Term Preterm AB Living   SAB TAB Ectopic Multiple Live Births         0 2      Past Medical History:  Diagnosis Date  . Preterm labor     Past Surgical History:  Procedure Laterality Date  . NO PAST SURGERIES      History reviewed. No pertinent family history.  Social History  Substance Use Topics  . Smoking status: Never Smoker  . Smokeless tobacco: Never Used  . Alcohol use No    Allergies:  Allergies  Allergen Reactions  . Neosporin [Neomycin-Bacitracin Zn-Polymyx] Rash    Facility-Administered Medications Prior to Admission  Medication Dose Route Frequency Provider Last Rate Last Dose  . hydroxyprogesterone caproate (MAKENA) 250 mg/mL injection 250 mg  250 mg Intramuscular Weekly Hermina Staggers, MD   250 mg at 12/27/16 1529   Prescriptions Prior to Admission  Medication Sig Dispense Refill Last Dose  . acetaminophen (TYLENOL) 325 MG tablet Take 650 mg by mouth every 6 (six) hours as needed.   Taking  . docusate sodium (COLACE) 100 MG capsule Take 1 capsule (100 mg total) by mouth  every 12 (twelve) hours. 60 capsule 0 Taking  . glucose blood (ACCU-CHEK GUIDE) test strip Use as instructed 100 each 5 Taking  . Lancets (ACCU-CHEK MULTICLIX) lancets Check blood glucose levels 4 times daily 100 each 5 Taking  . polyethylene glycol (MIRALAX / GLYCOLAX) packet Take 17 g by mouth daily.   Taking  . Prenatal Vit-Fe Fumarate-FA (PRENATAL MULTIVITAMIN) TABS tablet Take 1 tablet by mouth daily.   Taking  . progesterone (PROMETRIUM) 200 MG capsule Place one capsule vaginally at bedtime 30 capsule 3 Taking    Review of Systems  Respiratory: Negative.   Cardiovascular: Negative.   Gastrointestinal: Positive for abdominal pain.       Complains of muscukoskeletal pain on her left hand side.   Genitourinary: Positive for vaginal discharge. Negative for pelvic pain and vaginal bleeding.  Musculoskeletal: Negative.   Neurological: Negative.    Physical Exam   Blood pressure (!) 124/59, pulse 88, temperature 97.7 F (36.5 C), temperature source Oral, resp. rate 16, height  (1.6 m), weight 89.8 kg (198 lb), last menstrual period 06/04/2016, unknown if currently breastfeeding.  Physical Exam  Constitutional: She is oriented to person, place, and time. She appears well-developed and well-nourished.  Neck: Normal range of motion.  Respiratory: Effort normal.  GI: Soft. She exhibits no distension and no mass. There is tenderness. There is no rebound and no guarding.  Tenderness on  left UQ  Genitourinary: Vaginal discharge found.  Genitourinary Comments: NEFG; no lesions on external labia, no lesions on vaginal walls. Clumpy white discharge adherant to vaginal walls.  No lesions on cervix, negative CMT, negative suprapubic and adnexal tendernmess  Musculoskeletal: Normal range of motion.  Neurological: She is alert and oriented to person, place, and time. She has normal reflexes.  Skin: Skin is warm and dry.  Psychiatric: She has a normal mood and affect.    MAU Course   Procedures  MDM -NST: 140 bpm with present accels, no decels, moderate variability, no contractions -wet prep: positive for yeast -GC CT pending  Assessment and Plan   1. NST (non-stress test) reactive   2. Yeast infection involving the vagina and surrounding area    2. Patient stable for discharge for instructions to keep her prenatal appointment this week.  3. RX for terazol sent to pharmacy on file.  4. Reviewed when to return to MAU (bleeding, leaking of fluid, decreased fetal movements).   Charlesetta Garibaldi [name] CNM 01/01/2017, 8:12 PM

## 2017-01-01 NOTE — Discharge Instructions (Signed)

## 2017-01-02 ENCOUNTER — Ambulatory Visit (INDEPENDENT_AMBULATORY_CARE_PROVIDER_SITE_OTHER): Payer: Medicaid Other | Admitting: Obstetrics

## 2017-01-02 VITALS — BP 121/65 | HR 87 | Wt 198.4 lb

## 2017-01-02 DIAGNOSIS — Z3483 Encounter for supervision of other normal pregnancy, third trimester: Secondary | ICD-10-CM

## 2017-01-02 DIAGNOSIS — O0992 Supervision of high risk pregnancy, unspecified, second trimester: Secondary | ICD-10-CM

## 2017-01-02 DIAGNOSIS — O26872 Cervical shortening, second trimester: Secondary | ICD-10-CM | POA: Diagnosis not present

## 2017-01-02 DIAGNOSIS — Z3480 Encounter for supervision of other normal pregnancy, unspecified trimester: Secondary | ICD-10-CM

## 2017-01-02 LAB — OB RESULTS CONSOLE GC/CHLAMYDIA
CHLAMYDIA, DNA PROBE: NEGATIVE
Gonorrhea: NEGATIVE

## 2017-01-02 LAB — GC/CHLAMYDIA PROBE AMP (~~LOC~~) NOT AT ARMC
Chlamydia: NEGATIVE
Neisseria Gonorrhea: NEGATIVE

## 2017-01-02 NOTE — Progress Notes (Signed)
Patient presents for ROB/GBS/17P. Makena given LUOQ. Patient tolerated well. Administrations This Visit    hydroxyprogesterone caproate (MAKENA) 250 mg/mL injection 250 mg    Admin Date 01/02/2017 Action Given Dose 250 mg Route Intramuscular Administered By Maretta Bees, RMA

## 2017-01-02 NOTE — Progress Notes (Addendum)
Subjective:  Kim Robertson is a 36 y.o. G3P1102 at [redacted]w[redacted]d being seen today for ongoing prenatal care.  She is currently monitored for the following issues for this low-risk pregnancy and has History of preterm delivery, currently pregnant in third trimester; Supervision of high-risk pregnancy; Cervical shortening; and Gestational diabetes mellitus (GDM), antepartum on her problem list.  Patient reports backache, occasional contractions and pressure.  Contractions: Irritability. Vag. Bleeding: None.  Movement: Present. Denies leaking of fluid.   The following portions of the patient's history were reviewed and updated as appropriate: allergies, current medications, past family history, past medical history, past social history, past surgical history and problem list. Problem list updated.  Objective:   Vitals:   01/02/17 1531  BP: 121/65  Pulse: 87  Weight: 198 lb 6.4 oz (90 kg)    Fetal Status: Fetal Heart Rate (bpm): 140   Movement: Present     General:  Alert, oriented and cooperative. Patient is in no acute distress.  Skin: Skin is warm and dry. No rash noted.   Cardiovascular: Normal heart rate noted  Respiratory: Normal respiratory effort, no problems with respiration noted  Abdomen: Soft, gravid, appropriate for gestational age. Pain/Pressure: Present     Pelvic:  Cervical exam performed       Cvx:  Long / Closed / -2 / Vtx  Extremities: Normal range of motion.  Edema: Trace  Mental Status: Normal mood and affect. Normal behavior. Normal judgment and thought content.   Urinalysis:      NST:  Reactive.  Occasional UC, mild  Assessment and Plan:  Pregnancy: [id number] at [redacted]w[redacted]d  There are no diagnoses linked to this encounter. Preterm labor symptoms and general obstetric precautions including but not limited to vaginal bleeding, contractions, leaking of fluid and fetal movement were reviewed in detail with the patient. Please refer to After Visit Summary for other counseling  recommendations.  Return in 1 week (on 01/09/2017) for ROB.   Brock Bad, MDPatient ID: Kim Robertson, female   DOB: 1981/01/15, 36 y.o.   MRN: [id number]

## 2017-01-03 LAB — OB RESULTS CONSOLE GBS: STREP GROUP B AG: NEGATIVE

## 2017-01-04 LAB — STREP GP B NAA: Strep Gp B NAA: NEGATIVE

## 2017-01-09 ENCOUNTER — Telehealth: Payer: Self-pay

## 2017-01-09 ENCOUNTER — Encounter: Payer: Medicaid Other | Admitting: Obstetrics

## 2017-01-09 ENCOUNTER — Ambulatory Visit (INDEPENDENT_AMBULATORY_CARE_PROVIDER_SITE_OTHER): Payer: Medicaid Other | Admitting: Obstetrics

## 2017-01-09 ENCOUNTER — Encounter: Payer: Self-pay | Admitting: Obstetrics

## 2017-01-09 VITALS — BP 123/68 | HR 81 | Wt 203.5 lb

## 2017-01-09 DIAGNOSIS — O0993 Supervision of high risk pregnancy, unspecified, third trimester: Secondary | ICD-10-CM

## 2017-01-09 DIAGNOSIS — Z3480 Encounter for supervision of other normal pregnancy, unspecified trimester: Secondary | ICD-10-CM

## 2017-01-09 DIAGNOSIS — Z3483 Encounter for supervision of other normal pregnancy, third trimester: Secondary | ICD-10-CM

## 2017-01-09 MED ORDER — PRENATAL VITAMINS 0.8 MG PO TABS: 1.0000 | ORAL_TABLET | Freq: Every day | ORAL | 12 refills | Status: DC

## 2017-01-09 MED ORDER — PRENATAL MULTIVITAMIN CH: 1.0000 | ORAL_TABLET | Freq: Every day | ORAL | 6 refills | Status: DC

## 2017-01-09 NOTE — Telephone Encounter (Signed)
Patient called requesting PNV to be sent to pharmacy.

## 2017-01-09 NOTE — Progress Notes (Signed)
Subjective:  Kim Robertson is a 36 y.o. G3P1102 at 8842w5d being seen today for ongoing prenatal care.  She is currently monitored for the following issues for this high-risk pregnancy and has History of preterm delivery, currently pregnant in third trimester; Supervision of high-risk pregnancy; Cervical shortening; and Gestational diabetes mellitus (GDM), antepartum on her problem list.  Patient reports no complaints.  Contractions: Not present. Vag. Bleeding: None.  Movement: Present. Denies leaking of fluid.   The following portions of the patient's history were reviewed and updated as appropriate: allergies, current medications, past family history, past medical history, past social history, past surgical history and problem list. Problem list updated.  Objective:   Vitals:   01/09/17 1444  BP: 123/68  Pulse: 81  Weight: 203 lb 8 oz (92.3 kg)    Fetal Status: Fetal Heart Rate (bpm): 150   Movement: Present     General:  Alert, oriented and cooperative. Patient is in no acute distress.  Skin: Skin is warm and dry. No rash noted.   Cardiovascular: Normal heart rate noted  Respiratory: Normal respiratory effort, no problems with respiration noted  Abdomen: Soft, gravid, appropriate for gestational age. Pain/Pressure: Present     Pelvic:  Cervical exam deferred        Extremities: Normal range of motion.  Edema: None  Mental Status: Normal mood and affect. Normal behavior. Normal judgment and thought content.   Urinalysis:      Assessment and Plan:  Pregnancy: [id number] at 6242w5d  There are no diagnoses linked to this encounter. Preterm labor symptoms and general obstetric precautions including but not limited to vaginal bleeding, contractions, leaking of fluid and fetal movement were reviewed in detail with the patient. Please refer to After Visit Summary for other counseling recommendations.  Return in 1 week (on 01/16/2017) for ROB.   Brock BadHarper, Issam Carlyon A, MDPatient ID: Kim LawlessAhoefa  Ladson, female   DOB: 01-25-1981, 36 y.o.   MRN: [id number]

## 2017-01-11 ENCOUNTER — Ambulatory Visit (HOSPITAL_COMMUNITY)
Admission: RE | Admit: 2017-01-11 | Discharge: 2017-01-11 | Disposition: A | Discharge status: Home / Self Care | Payer: Medicaid Other | Source: Ambulatory Visit | Attending: Obstetrics and Gynecology | Admitting: Obstetrics and Gynecology

## 2017-01-11 DIAGNOSIS — O09219 Supervision of pregnancy with history of pre-term labor, unspecified trimester: Secondary | ICD-10-CM | POA: Insufficient documentation

## 2017-01-11 DIAGNOSIS — Z3A37 37 weeks gestation of pregnancy: Secondary | ICD-10-CM | POA: Insufficient documentation

## 2017-01-11 DIAGNOSIS — O24410 Gestational diabetes mellitus in pregnancy, diet controlled: Secondary | ICD-10-CM | POA: Insufficient documentation

## 2017-01-16 ENCOUNTER — Encounter: Payer: Self-pay | Admitting: Obstetrics & Gynecology

## 2017-01-16 ENCOUNTER — Ambulatory Visit (INDEPENDENT_AMBULATORY_CARE_PROVIDER_SITE_OTHER): Payer: Medicaid Other | Admitting: Obstetrics & Gynecology

## 2017-01-16 VITALS — BP 101/62 | HR 80 | Wt 195.7 lb

## 2017-01-16 DIAGNOSIS — O09893 Supervision of other high risk pregnancies, third trimester: Secondary | ICD-10-CM

## 2017-01-16 DIAGNOSIS — O0993 Supervision of high risk pregnancy, unspecified, third trimester: Secondary | ICD-10-CM

## 2017-01-16 DIAGNOSIS — O09213 Supervision of pregnancy with history of pre-term labor, third trimester: Secondary | ICD-10-CM

## 2017-01-16 DIAGNOSIS — O24410 Gestational diabetes mellitus in pregnancy, diet controlled: Secondary | ICD-10-CM

## 2017-01-16 MED ORDER — FLUCONAZOLE 150 MG PO TABS: 150.0000 mg | ORAL_TABLET | Freq: Once | ORAL | 0 refills | Status: AC

## 2017-01-16 NOTE — Progress Notes (Signed)
   PRENATAL VISIT NOTE  Subjective:  Kim Robertson is a 36 y.o. G3P1102 at 3143w5d being seen today for ongoing prenatal care.  She is currently monitored for the following issues for this high-risk pregnancy and has History of preterm delivery, currently pregnant in third trimester; Supervision of high-risk pregnancy; Cervical shortening; and Gestational diabetes mellitus (GDM), antepartum on her problem list.  Patient reports no complaints.  Contractions: Not present. Vag. Bleeding: None.  Movement: Present. Denies leaking of fluid.   The following portions of the patient's history were reviewed and updated as appropriate: allergies, current medications, past family history, past medical history, past social history, past surgical history and problem list. Problem list updated.  Objective:   Vitals:   01/16/17 1119  BP: 101/62  Pulse: 80  Weight: 88.8 kg (195 lb 11.2 oz)    Fetal Status:     Movement: Present     General:  Alert, oriented and cooperative. Patient is in no acute distress.  Skin: Skin is warm and dry. No rash noted.   Cardiovascular: Normal heart rate noted  Respiratory: Normal respiratory effort, no problems with respiration noted  Abdomen: Soft, gravid, appropriate for gestational age. Pain/Pressure: Present     Pelvic:  Cervical exam deferred        Extremities: Normal range of motion.  Edema: Mild pitting, slight indentation  Mental Status: Normal mood and affect. Normal behavior. Normal judgment and thought content.   Assessment and Plan:  Pregnancy: G3P1102 at 7843w5d  1. Supervision of high risk pregnancy in third trimester Yeast sx - fluconazole (DIFLUCAN) 150 MG tablet; Take 1 tablet (150 mg total) by mouth once.  Dispense: 1 tablet; Refill: 0  2. History of preterm delivery, currently pregnant in third trimester Doing well  3. Diet controlled gestational diabetes mellitus (GDM), antepartum Diet controlled, nl growth  Term labor symptoms and general  obstetric precautions including but not limited to vaginal bleeding, contractions, leaking of fluid and fetal movement were reviewed in detail with the patient. Please refer to After Visit Summary for other counseling recommendations.  Return in about 1 week (around 01/23/2017).   Adam PhenixArnold, James G, MD

## 2017-01-16 NOTE — Patient Instructions (Signed)

## 2017-01-23 ENCOUNTER — Ambulatory Visit (INDEPENDENT_AMBULATORY_CARE_PROVIDER_SITE_OTHER): Payer: Medicaid Other | Admitting: Obstetrics & Gynecology

## 2017-01-23 VITALS — BP 112/86 | HR 82 | Wt 198.7 lb

## 2017-01-23 DIAGNOSIS — O0993 Supervision of high risk pregnancy, unspecified, third trimester: Secondary | ICD-10-CM

## 2017-01-23 DIAGNOSIS — O24410 Gestational diabetes mellitus in pregnancy, diet controlled: Secondary | ICD-10-CM

## 2017-01-23 NOTE — Patient Instructions (Signed)
Labor Induction Labor induction is when steps are taken to cause a pregnant woman to begin the labor process. Most women go into labor on their own between 37 weeks and 42 weeks of the pregnancy. When this does not happen or when there is a medical need, methods may be used to induce labor. Labor induction causes a pregnant woman's uterus to contract. It also causes the cervix to soften (ripen), open (dilate), and thin out (efface). Usually, labor is not induced before 39 weeks of the pregnancy unless there is a problem with the baby or mother. Before inducing labor, your health care provider will consider a number of factors, including the following:  The medical condition of you and the baby.  How many weeks along you are.  The status of the baby's lung maturity.  The condition of the cervix.  The position of the baby. What are the reasons for labor induction? Labor may be induced for the following reasons:  The health of the baby or mother is at risk.  The pregnancy is overdue by 1 week or more.  The water breaks but labor does not start on its own.  The mother has a health condition or serious illness, such as high blood pressure, infection, placental abruption, or diabetes.  The amniotic fluid amounts are low around the baby.  The baby is distressed. Convenience or wanting the baby to be born on a certain date is not a reason for inducing labor. What methods are used for labor induction? Several methods of labor induction may be used, such as:  Prostaglandin medicine. This medicine causes the cervix to dilate and ripen. The medicine will also start contractions. It can be taken by mouth or by inserting a suppository into the vagina.  Inserting a thin tube (catheter) with a balloon on the end into the vagina to dilate the cervix. Once inserted, the balloon is expanded with water, which causes the cervix to open.  Stripping the membranes. Your health care provider separates  amniotic sac tissue from the cervix, causing the cervix to be stretched and causing the release of a hormone called progesterone. This may cause the uterus to contract. It is often done during an office visit. You will be sent home to wait for the contractions to begin. You will then come in for an induction.  Breaking the water. Your health care provider makes a hole in the amniotic sac using a small instrument. Once the amniotic sac breaks, contractions should begin. This may still take hours to see an effect.  Medicine to trigger or strengthen contractions. This medicine is given through an IV access tube inserted into a vein in your arm. All of the methods of induction, besides stripping the membranes, will be done in the hospital. Induction is done in the hospital so that you and the baby can be carefully monitored. How long does it take for labor to be induced? Some inductions can take up to 2-3 days. Depending on the cervix, it usually takes less time. It takes longer when you are induced early in the pregnancy or if this is your first pregnancy. If a mother is still pregnant and the induction has been going on for 2-3 days, either the mother will be sent home or a cesarean delivery will be needed. What are the risks associated with labor induction? Some of the risks of induction include:  Changes in fetal heart rate, such as too high, too low, or erratic.  Fetal distress.    Chance of infection for the mother and baby.  Increased chance of having a cesarean delivery.  Breaking off (abruption) of the placenta from the uterus (rare).  Uterine rupture (very rare). When induction is needed for medical reasons, the benefits of induction may outweigh the risks. What are some reasons for not inducing labor? Labor induction should not be done if:  It is shown that your baby does not tolerate labor.  You have had previous surgeries on your uterus, such as a myomectomy or the removal of  fibroids.  Your placenta lies very low in the uterus and blocks the opening of the cervix (placenta previa).  Your baby is not in a head-down position.  The umbilical cord drops down into the birth canal in front of the baby. This could cut off the baby's blood and oxygen supply.  You have had a previous cesarean delivery.  There are unusual circumstances, such as the baby being extremely premature. This information is not intended to replace advice given to you by your health care provider. Make sure you discuss any questions you have with your health care provider. Document Released: 01/10/2007 Document Revised: 01/27/2016 Document Reviewed: 03/20/2013 Elsevier Interactive Patient Education  2017 Elsevier Inc[employer]  

## 2017-01-23 NOTE — Progress Notes (Signed)
   PRENATAL VISIT NOTE  Subjective:  Kim Robertson is a 36 y.o. G3P1102 at 5377w5d being seen today for ongoing prenatal care.  She is currently monitored for the following issues for this high-risk pregnancy and has History of preterm delivery, currently pregnant in third trimester; Supervision of high-risk pregnancy; Cervical shortening; and Gestational diabetes mellitus (GDM), antepartum on her problem list.  Patient reports no complaints.  Contractions: Not present. Vag. Bleeding: None.  Movement: Present. Denies leaking of fluid.   The following portions of the patient's history were reviewed and updated as appropriate: allergies, current medications, past family history, past medical history, past social history, past surgical history and problem list. Problem list updated.  Objective:   Vitals:   01/23/17 1521  BP: 112/86  Pulse: 82  Weight: 198 lb 11.2 oz (90.1 kg)    Fetal Status: Fetal Heart Rate (bpm): 142   Movement: Present     General:  Alert, oriented and cooperative. Patient is in no acute distress.  Skin: Skin is warm and dry. No rash noted.   Cardiovascular: Normal heart rate noted  Respiratory: Normal respiratory effort, no problems with respiration noted  Abdomen: Soft, gravid, appropriate for gestational age. Pain/Pressure: Present     Pelvic:  Cervical exam performed        Extremities: Normal range of motion.  Edema: Trace  Mental Status: Normal mood and affect. Normal behavior. Normal judgment and thought content.   Assessment and Plan:  Pregnancy: G3P1102 at 3677w5d  1. Supervision of high risk pregnancy in third trimester IOL 40 week   2. Diet controlled gestational diabetes mellitus (GDM), antepartum States BG still well controlled on diet  Term labor symptoms and general obstetric precautions including but not limited to vaginal bleeding, contractions, leaking of fluid and fetal movement were reviewed in detail with the patient. Please refer to After  Visit Summary for other counseling recommendations.  Return in about 1 week (around 01/30/2017).   Scheryl DarterJames Charlayne Vultaggio, MD

## 2017-01-24 ENCOUNTER — Other Ambulatory Visit: Payer: Self-pay | Admitting: Advanced Practice Midwife

## 2017-01-25 ENCOUNTER — Inpatient Hospital Stay (HOSPITAL_COMMUNITY)
Admission: AD | Admit: 2017-01-25 | Discharge: 2017-01-27 | DRG: 775 | Disposition: A | Discharge status: Home / Self Care | Payer: Medicaid Other | Source: Ambulatory Visit | Attending: Obstetrics & Gynecology | Admitting: Obstetrics & Gynecology

## 2017-01-25 ENCOUNTER — Encounter (HOSPITAL_COMMUNITY): Payer: Self-pay | Admitting: *Deleted

## 2017-01-25 DIAGNOSIS — Z3A39 39 weeks gestation of pregnancy: Secondary | ICD-10-CM

## 2017-01-25 DIAGNOSIS — O24420 Gestational diabetes mellitus in childbirth, diet controlled: Secondary | ICD-10-CM | POA: Diagnosis present

## 2017-01-25 DIAGNOSIS — O26873 Cervical shortening, third trimester: Secondary | ICD-10-CM | POA: Diagnosis present

## 2017-01-25 HISTORY — DX: Gestational diabetes mellitus in pregnancy, unspecified control: O24.419

## 2017-01-25 LAB — GLUCOSE, CAPILLARY: GLUCOSE-CAPILLARY: 87 mg/dL (ref 65–99)

## 2017-01-25 LAB — CBC
HCT: 37.4 % (ref 36.0–46.0)
Hemoglobin: 12.2 g/dL (ref 12.0–15.0)
MCH: 26.8 pg (ref 26.0–34.0)
MCHC: 32.6 g/dL (ref 30.0–36.0)
MCV: 82.2 fL (ref 78.0–100.0)
PLATELETS: 259 10*3/uL (ref 150–400)
RBC: 4.55 MIL/uL (ref 3.87–5.11)
RDW: 14.8 % (ref 11.5–15.5)
WBC: 9.9 10*3/uL (ref 4.0–10.5)

## 2017-01-25 LAB — TYPE AND SCREEN
ABO/RH(D): B POS
ANTIBODY SCREEN: NEGATIVE

## 2017-01-25 MED ORDER — OXYCODONE-ACETAMINOPHEN 5-325 MG PO TABS: 1.0000 | ORAL_TABLET | ORAL | Status: DC | PRN

## 2017-01-25 MED ORDER — BENZOCAINE-MENTHOL 20-0.5 % EX AERO
1.0000 "application " | INHALATION_SPRAY | CUTANEOUS | Status: DC | PRN
  Filled 2017-01-25: qty 56

## 2017-01-25 MED ORDER — OXYCODONE HCL 5 MG PO TABS
5.0000 mg | ORAL_TABLET | ORAL | Status: DC | PRN
  Administered 2017-01-26: 5 mg via ORAL
  Filled 2017-01-25: qty 1

## 2017-01-25 MED ORDER — LACTATED RINGERS IV SOLN: 500.0000 mL | INTRAVENOUS | Status: DC | PRN

## 2017-01-25 MED ORDER — SIMETHICONE 80 MG PO CHEW: 80.0000 mg | CHEWABLE_TABLET | ORAL | Status: DC | PRN

## 2017-01-25 MED ORDER — DIPHENHYDRAMINE HCL 25 MG PO CAPS: 25.0000 mg | ORAL_CAPSULE | Freq: Four times a day (QID) | ORAL | Status: DC | PRN

## 2017-01-25 MED ORDER — DOCUSATE SODIUM 100 MG PO CAPS
100.0000 mg | ORAL_CAPSULE | Freq: Two times a day (BID) | ORAL | Status: DC
  Administered 2017-01-26 – 2017-01-27 (×4): 100 mg via ORAL
  Filled 2017-01-25 (×4): qty 1

## 2017-01-25 MED ORDER — LACTATED RINGERS IV SOLN
INTRAVENOUS | Status: DC
  Administered 2017-01-25: 18:00:00 via INTRAVENOUS

## 2017-01-25 MED ORDER — ZOLPIDEM TARTRATE 5 MG PO TABS: 5.0000 mg | ORAL_TABLET | Freq: Every evening | ORAL | Status: DC | PRN

## 2017-01-25 MED ORDER — OXYCODONE-ACETAMINOPHEN 5-325 MG PO TABS: 2.0000 | ORAL_TABLET | ORAL | Status: DC | PRN

## 2017-01-25 MED ORDER — LIDOCAINE HCL (PF) 1 % IJ SOLN
30.0000 mL | INTRAMUSCULAR | Status: DC | PRN
  Administered 2017-01-25: 30 mL via SUBCUTANEOUS
  Filled 2017-01-25: qty 30

## 2017-01-25 MED ORDER — OXYTOCIN BOLUS FROM INFUSION
500.0000 mL | Freq: Once | INTRAVENOUS | Status: AC
  Administered 2017-01-25: 500 mL via INTRAVENOUS

## 2017-01-25 MED ORDER — COCONUT OIL OIL: 1.0000 "application " | TOPICAL_OIL | Status: DC | PRN

## 2017-01-25 MED ORDER — METHYLERGONOVINE MALEATE 0.2 MG PO TABS: 0.2000 mg | ORAL_TABLET | ORAL | Status: DC | PRN

## 2017-01-25 MED ORDER — FERROUS SULFATE 325 (65 FE) MG PO TABS
325.0000 mg | ORAL_TABLET | Freq: Two times a day (BID) | ORAL | Status: DC
  Administered 2017-01-26 – 2017-01-27 (×3): 325 mg via ORAL
  Filled 2017-01-25 (×3): qty 1

## 2017-01-25 MED ORDER — OXYTOCIN 40 UNITS IN LACTATED RINGERS INFUSION - SIMPLE MED
2.5000 [IU]/h | INTRAVENOUS | Status: DC
  Filled 2017-01-25: qty 1000

## 2017-01-25 MED ORDER — ONDANSETRON HCL 4 MG/2ML IJ SOLN: 4.0000 mg | INTRAMUSCULAR | Status: DC | PRN

## 2017-01-25 MED ORDER — ONDANSETRON HCL 4 MG/2ML IJ SOLN: 4.0000 mg | Freq: Four times a day (QID) | INTRAMUSCULAR | Status: DC | PRN

## 2017-01-25 MED ORDER — FLEET ENEMA 7-19 GM/118ML RE ENEM: 1.0000 | ENEMA | Freq: Every day | RECTAL | Status: DC | PRN

## 2017-01-25 MED ORDER — WITCH HAZEL-GLYCERIN EX PADS: 1.0000 "application " | MEDICATED_PAD | CUTANEOUS | Status: DC | PRN

## 2017-01-25 MED ORDER — PRENATAL MULTIVITAMIN CH: 1.0000 | ORAL_TABLET | Freq: Every day | ORAL | Status: DC

## 2017-01-25 MED ORDER — ACETAMINOPHEN 325 MG PO TABS: 650.0000 mg | ORAL_TABLET | ORAL | Status: DC | PRN

## 2017-01-25 MED ORDER — METHYLERGONOVINE MALEATE 0.2 MG/ML IJ SOLN: 0.2000 mg | INTRAMUSCULAR | Status: DC | PRN

## 2017-01-25 MED ORDER — ONDANSETRON HCL 4 MG PO TABS: 4.0000 mg | ORAL_TABLET | ORAL | Status: DC | PRN

## 2017-01-25 MED ORDER — SOD CITRATE-CITRIC ACID 500-334 MG/5ML PO SOLN: 30.0000 mL | ORAL | Status: DC | PRN

## 2017-01-25 MED ORDER — FENTANYL CITRATE (PF) 100 MCG/2ML IJ SOLN
100.0000 ug | INTRAMUSCULAR | Status: DC | PRN
  Administered 2017-01-25: 100 ug via INTRAVENOUS
  Filled 2017-01-25: qty 2

## 2017-01-25 MED ORDER — IBUPROFEN 600 MG PO TABS
600.0000 mg | ORAL_TABLET | Freq: Four times a day (QID) | ORAL | Status: DC
  Administered 2017-01-26 – 2017-01-27 (×6): 600 mg via ORAL
  Filled 2017-01-25 (×6): qty 1

## 2017-01-25 MED ORDER — PRENATAL MULTIVITAMIN CH
1.0000 | ORAL_TABLET | Freq: Every day | ORAL | Status: DC
  Administered 2017-01-26 – 2017-01-27 (×2): 1 via ORAL
  Filled 2017-01-25 (×2): qty 1

## 2017-01-25 MED ORDER — BISACODYL 10 MG RE SUPP: 10.0000 mg | Freq: Every day | RECTAL | Status: DC | PRN

## 2017-01-25 MED ORDER — TETANUS-DIPHTH-ACELL PERTUSSIS 5-2.5-18.5 LF-MCG/0.5 IM SUSP: 0.5000 mL | Freq: Once | INTRAMUSCULAR | Status: DC

## 2017-01-25 MED ORDER — ACETAMINOPHEN 325 MG PO TABS
650.0000 mg | ORAL_TABLET | ORAL | Status: DC | PRN
  Administered 2017-01-26: 650 mg via ORAL
  Filled 2017-01-25: qty 2

## 2017-01-25 MED ORDER — OXYCODONE HCL 5 MG PO TABS
10.0000 mg | ORAL_TABLET | ORAL | Status: DC | PRN
  Administered 2017-01-26: 10 mg via ORAL
  Filled 2017-01-25: qty 2

## 2017-01-25 MED ORDER — DIBUCAINE 1 % RE OINT: 1.0000 "application " | TOPICAL_OINTMENT | RECTAL | Status: DC | PRN

## 2017-01-25 NOTE — H&P (Signed)
LABOR AND DELIVERY ADMISSION HISTORY AND PHYSICAL NOTE  Kim Robertson is a 36 y.o. female [id number] with IUP at 471w0d by LMP presenting for SOL. She reports +FM, + contractions, No LOF, no VB, no blurry vision, headaches or peripheral edema, and RUQ pain.  She plans on breast and bottle feeding.   Patient does have a previous preterm delivery due to PROM (26w delivered at 27w) and she also has GDM A1.  Dating: By LMP  --->  Estimated Date of Delivery: 02/01/17  Sono:    @[redacted]w[redacted]d , CWD, normal anatomy, cephalic presentation, [phone]   Prenatal History/Complications:  Past Medical History: Past Medical History:  Diagnosis Date  . Preterm labor     Past Surgical History: Past Surgical History:  Procedure Laterality Date  . NO PAST SURGERIES      Obstetrical History: OB History    Gravida Para Term Preterm AB Living   3 2 1 1   2    SAB TAB Ectopic Multiple Live Births         0 2      Social History: Social History   Social History  . Marital status: Married    Spouse name: N/A  . Number of children: N/A  . Years of education: N/A   Social History Main Topics  . Smoking status: Never Smoker  . Smokeless tobacco: Never Used  . Alcohol use No  . Drug use: No  . Sexual activity: Not Currently   Other Topics Concern  . Not on file   Social History Narrative  . No narrative on file    Family History: No family history on file.  Allergies: Allergies  Allergen Reactions  . Neosporin [Neomycin-Bacitracin Zn-Polymyx] Rash    Prescriptions Prior to Admission  Medication Sig Dispense Refill Last Dose  . acetaminophen (TYLENOL) 325 MG tablet Take 650 mg by mouth every 6 (six) hours as needed.   Not Taking  . glucose blood (ACCU-CHEK GUIDE) test strip Use as instructed 100 each 5 Taking  . Lancets (ACCU-CHEK MULTICLIX) lancets Check blood glucose levels 4 times daily 100 each 5 Taking  . polyethylene glycol (MIRALAX / GLYCOLAX) packet Take 17 g by mouth daily.    Taking  . Prenatal Multivit-Min-Fe-FA (PRENATAL VITAMINS) 0.8 MG tablet Take 1 tablet by mouth daily. 30 tablet 12      Review of Systems   All systems reviewed and negative except as stated in HPI  Physical Exam Blood pressure 124/74, pulse 82, temperature 97.6 F (36.4 C), resp. rate 18, height 5\' 3"  (1.6 m), weight 197 lb (89.4 kg), last menstrual period 06/04/2016, unknown if currently breastfeeding. General appearance: alert, cooperative, appears stated age and no distress Lungs: clear to auscultation bilaterally Heart: regular rate and rhythm Abdomen: soft, non-tender; bowel sounds normal Extremities: No calf swelling or tenderness Presentation: cephalic Fetal monitoring: 150 bpm, mod variability, accels, no decels Uterine activity: occasional contractions Dilation: 5 Effacement (%): 90 Station: -3 Exam by:: Janeth Rasehristina Robinson RNC   Prenatal labs: ABO, Rh: B/Positive/-- (01/29 1131) Antibody: Negative (01/29 1131) Rubella: Nonreactive RPR: Non Reactive (03/06 1135)  HBsAg: Negative (01/29 1131)  HIV: Non Reactive (03/06 1135)  GBS: Negative (05/02 0000)  1 hr Glucola: GDM Genetic screening:  negative Anatomy US: normal  Prenatal Transfer Tool  Maternal Diabetes: Yes:  Diabetes Type:  Diet controlled Genetic Screening: Normal Maternal Ultrasounds/Referrals: Normal Fetal Ultrasounds or other Referrals:  Referred to Materal Fetal Medicine  Maternal Substance Abuse:  No Significant Maternal Medications:  None Significant Maternal Lab Results: Lab values include: Group B Strep negative  No results found for this or any previous visit (from the past 24 hour(s)).  Patient Active Problem List   Diagnosis Date Noted  . Gestational diabetes mellitus (GDM), antepartum 11/21/2016  . Cervical shortening 10/24/2016  . Supervision of high-risk pregnancy 10/02/2016  . History of preterm delivery, currently pregnant in third trimester     Assessment: Kim Robertson is a  36 y.o. G3P1102 at [redacted]w[redacted]d here for SOL. Presenting with contractions q46m at home, increased discomfort and cervix 5-6 cm dilated and 90% effaced, change from 2.5 cm in clinic 2 days ago.   GDM A1  - POC CBG - if maintains elevated sugars >120 consider initiating insulin/D5 gtt and q2H CBG  Spontaneous onset of labor #Labor:Progressing normally #Pain: Well controlled with IV medications #FWB: Category 1 tracing #ID:  GBS negative #MOF: breast and bottle #MOC:undecided #Circ:  outpatient  Howard Pouch, MD PGY-1 [facility]  01/25/2017, 5:31 PM   I have seen and examined this patient and agree with the management plan.

## 2017-01-25 NOTE — MAU Note (Signed)
Pt reports she has been having ctx on and off since this morning. About 5 min apart now.  Reports some wetness in her underwear now but no bleeding. Good fetal movement reported.

## 2017-01-25 NOTE — Progress Notes (Signed)
Vitals:   01/25/17 1900 01/25/17 1930  BP: 115/73   Pulse: 73   Resp: 17   Temp:  97.6 F (36.4 C)   cx 8/90/-1. AROM clear fluid. FHR cat 1.  Anticipate SVD soon.  IV meds only per pt request

## 2017-01-26 LAB — GLUCOSE, CAPILLARY
Glucose-Capillary: 100 mg/dL — ABNORMAL HIGH (ref 65–99)
Glucose-Capillary: 102 mg/dL — ABNORMAL HIGH (ref 65–99)
Glucose-Capillary: 141 mg/dL — ABNORMAL HIGH (ref 65–99)
Glucose-Capillary: 164 mg/dL — ABNORMAL HIGH (ref 65–99)
Glucose-Capillary: 99 mg/dL (ref 65–99)

## 2017-01-26 LAB — RPR: RPR: NONREACTIVE

## 2017-01-26 NOTE — Lactation Note (Signed)
This note was copied from a baby's chart. Lactation Consultation Note Baby 5 hrs old. Mom's 3rd baby. Didn't BF 1st baby d/t pre-mature. BF 2nd baby 8 months. Would occasionally give formula when out and about or someone else watching baby. Mom states she will be breast/bottle/formula. Mostly BF but if the baby ate to much then she would give formula also. Educated on newborn feeding habits, cluster feeding, I&O, STS, supply and demand.  Mom encouraged to feed baby 8-12 times/24 hours and with feeding cues.  [facility] brochure given w/resources, support groups and LC services. Patient Name: Boy Sheran Lawlesshoefa Lema ZOXWR'UToday's Date: 01/26/2017 Reason for consult: Initial assessment   Maternal Data Has patient been taught Hand Expression?: Yes Does the patient have breastfeeding experience prior to this delivery?: Yes  Feeding Feeding Type: Breast Fed Length of feed: 20 min  LATCH Score/Interventions Latch: Repeated attempts needed to sustain latch, nipple held in mouth throughout feeding, stimulation needed to elicit sucking reflex.  Audible Swallowing: A few with stimulation  Type of Nipple: Everted at rest and after stimulation  Comfort (Breast/Nipple): Soft / non-tender     Hold (Positioning): No assistance needed to correctly position infant at breast.  LATCH Score: 8  Lactation Tools Discussed/Used     Consult Status Consult Status: Follow-up Date: 01/27/17 Follow-up type: In-patient    Zaden Sako, Diamond NickelLAURA G 01/26/2017, 2:28 AM

## 2017-01-27 MED ORDER — IBUPROFEN 600 MG PO TABS: 600.0000 mg | ORAL_TABLET | Freq: Four times a day (QID) | ORAL | 0 refills | Status: DC | PRN

## 2017-01-27 NOTE — Lactation Note (Signed)
This note was copied from a baby's chart. Lactation Consultation Note  P3, Ex BF. Baby 3736 hours old and mother states she has "very little milk". Reviewed hand expressed drops of breastmilk easily.  Encouraged mother. Baby recently bf for 10 min and fell asleep.  Mother relatched baby is football hold. Demonstrated how to achieve depth and compress breast during feeding to keep baby active. Intermittent sucks and swallows observed. Mom encouraged to feed baby 8-12 times/24 hours and with feeding cues.  Reviewed engorgement care and monitoring voids/stools. Provided mother w/ manual pump.   Patient Name: Kim Robertson ZOXWR'UToday's Date: 01/27/2017 Reason for consult: Follow-up assessment   Maternal Data    Feeding Feeding Type: Breast Fed Length of feed: 10 min  LATCH Score/Interventions Latch: Grasps breast easily, tongue down, lips flanged, rhythmical sucking.  Audible Swallowing: A few with stimulation  Type of Nipple: Everted at rest and after stimulation  Comfort (Breast/Nipple): Soft / non-tender     Hold (Positioning): Assistance needed to correctly position infant at breast and maintain latch. Intervention(s):  (depth)  LATCH Score: 8  Lactation Tools Discussed/Used     Consult Status Consult Status: Complete    Hardie PulleyBerkelhammer, Arisa Congleton Boschen 01/27/2017, 9:44 AM

## 2017-01-27 NOTE — Progress Notes (Signed)
Post Partum Day 2  Subjective:  Sheran Lawlesshoefa Antonucci is a 36 y.o. [id number] 6866w0d s/p NSVD.  No acute events overnight.  Pt denies problems with ambulating, voiding or po intake.  She endorses some nausea yesterday, but denies vomiting.  Pain is well controlled.  She has had flatus. She has had bowel movement.  Lochia Minimal.  Plan for birth control is IUD.  Method of Feeding: breast and bottle feeding  Objective: BP (!) 96/53   Pulse 65   Temp 98.2 F (36.8 C)   Resp 18   Ht 5\' 3"  (1.6 m)   Wt 89.4 kg (197 lb)   LMP 06/04/2016 (Approximate)   SpO2 100%   Breastfeeding? Unknown   BMI 34.90 kg/m   Physical Exam:  General: alert, cooperative and no distress Chest: CTAB Heart: RRR no m/r/g Abdomen: soft, nontender Uterine Fundus: fundus firm below umbilicus DVT Evaluation: No evidence of DVT seen on physical exam. Extremities: no LE edema   Recent Labs  01/25/17 1747  HGB 12.2  HCT 37.4    Assessment/Plan:  ASSESSMENT: Sheran Lawlesshoefa Monacelli is a 36 y.o. [id number] 4466w0d ppd #2 s/p NSVDdoing well.   Consider discharge today    LOS: 2 days   Ivan AnchorsJohn Adeolu [facility] HospitalKeku Medical Student 01/27/2017, 7:57 AM

## 2017-01-27 NOTE — Discharge Instructions (Signed)
NO SEX UNTIL AFTER YOU GET YOUR BIRTH CONTROL  ° ° °Postpartum Care After Vaginal Delivery °The period of time right after you deliver your newborn is called the postpartum period. °What kind of medical care will I receive? °· You may continue to receive fluids and medicines through an IV tube inserted into one of your veins. °· If an incision was made near your vagina (episiotomy) or if you had some vaginal tearing during delivery, cold compresses may be placed on your episiotomy or your tear. This helps to reduce pain and swelling. °· You may be given a squirt bottle to use when you go to the bathroom. You may use this until you are comfortable wiping as usual. To use the squirt bottle, follow these steps: °¨ Before you urinate, fill the squirt bottle with warm water. Do not use hot water. °¨ After you urinate, while you are sitting on the toilet, use the squirt bottle to rinse the area around your urethra and vaginal opening. This rinses away any urine and blood. °¨ You may do this instead of wiping. As you start healing, you may use the squirt bottle before wiping yourself. Make sure to wipe gently. °¨ Fill the squirt bottle with clean water every time you use the bathroom. °· You will be given sanitary pads to wear. °How can I expect to feel? °· You may not feel the need to urinate for several hours after delivery. °· You will have some soreness and pain in your abdomen and vagina. °· If you are breastfeeding, you may have uterine contractions every time you breastfeed for up to several weeks postpartum. Uterine contractions help your uterus return to its normal size. °· It is normal to have vaginal bleeding (lochia) after delivery. The amount and appearance of lochia is often similar to a menstrual period in the first week after delivery. It will gradually decrease over the next few weeks to a dry, yellow-brown discharge. For most women, lochia stops completely by 6-8 weeks after delivery. Vaginal bleeding can  vary from woman to woman. °· Within the first few days after delivery, you may have breast engorgement. This is when your breasts feel heavy, full, and uncomfortable. Your breasts may also throb and feel hard, tightly stretched, warm, and tender. After this occurs, you may have milk leaking from your breasts. Your health care provider can help you relieve discomfort due to breast engorgement. Breast engorgement should go away within a few days. °· You may feel more sad or worried than normal due to hormonal changes after delivery. These feelings should not last more than a few days. If these feelings do not go away after several days, speak with your health care provider. °How should I care for myself? °· Tell your health care provider if you have pain or discomfort. °· Drink enough water to keep your urine clear or pale yellow. °· Wash your hands thoroughly with soap and water for at least 20 seconds after changing your sanitary pads, after using the toilet, and before holding or feeding your baby. °· If you are not breastfeeding, avoid touching your breasts a lot. Doing this can make your breasts produce more milk. °· If you become weak or lightheaded, or you feel like you might faint, ask for help before: °¨ Getting out of bed. °¨ Showering. °· Change your sanitary pads frequently. Watch for any changes in your flow, such as a sudden increase in volume, a change in color, the passing of large blood   clots. If you pass a blood clot from your vagina, save it to show to your health care provider. Do not flush blood clots down the toilet without having your health care provider look at them. °· Make sure that all your vaccinations are up to date. This can help protect you and your baby from getting certain diseases. You may need to have immunizations done before you leave the hospital. °· If desired, talk with your health care provider about methods of family planning or birth control (contraception). °How can I start  bonding with my baby? °Spending as much time as possible with your baby is very important. During this time, you and your baby can get to know each other and develop a bond. Having your baby stay with you in your room (rooming in) can give you time to get to know your baby. Rooming in can also help you become comfortable caring for your baby. Breastfeeding can also help you bond with your baby. °How can I plan for returning home with my baby? °· Make sure that you have a car seat installed in your vehicle. °¨ Your car seat should be checked by a certified car seat installer to make sure that it is installed safely. °¨ Make sure that your baby fits into the car seat safely. °· Ask your health care provider any questions you have about caring for yourself or your baby. Make sure that you are able to contact your health care provider with any questions after leaving the hospital. °This information is not intended to replace advice given to you by your health care provider. Make sure you discuss any questions you have with your health care provider. °Document Released: 06/18/2007 Document Revised: 01/24/2016 Document Reviewed: 07/26/2015 °Elsevier Interactive Patient Education © 2017 [employer] ° ° °Breastfeeding °Deciding to breastfeed is one of the best choices you can make for you and your baby. A change in hormones during pregnancy causes your breast tissue to grow and increases the number and size of your milk ducts. These hormones also allow proteins, sugars, and fats from your blood supply to make breast milk in your milk-producing glands. Hormones prevent breast milk from being released before your baby is born as well as prompt milk flow after birth. Once breastfeeding has begun, thoughts of your baby, as well as his or her sucking or crying, can stimulate the release of milk from your milk-producing glands. °Benefits of breastfeeding °For Your Baby °· Your first milk (colostrum) helps your baby's digestive  system function better. °· There are antibodies in your milk that help your baby fight off infections. °· Your baby has a lower incidence of asthma, allergies, and sudden infant death syndrome. °· The nutrients in breast milk are better for your baby than infant formulas and are designed uniquely for your baby’s needs. °· Breast milk improves your baby's brain development. °· Your baby is less likely to develop other conditions, such as childhood obesity, asthma, or type 2 diabetes mellitus. °For You °· Breastfeeding helps to create a very special bond between you and your baby. °· Breastfeeding is convenient. Breast milk is always available at the correct temperature and costs nothing. °· Breastfeeding helps to burn calories and helps you lose the weight gained during pregnancy. °· Breastfeeding makes your uterus contract to its prepregnancy size faster and slows bleeding (lochia) after you give birth. °· Breastfeeding helps to lower your risk of developing type 2 diabetes mellitus, osteoporosis, and breast or ovarian cancer later   in life. °Signs that your baby is hungry °Early Signs of Hunger °· Increased alertness or activity. °· Stretching. °· Movement of the head from side to side. °· Movement of the head and opening of the mouth when the corner of the mouth or cheek is stroked (rooting). °· Increased sucking sounds, smacking lips, cooing, sighing, or squeaking. °· Hand-to-mouth movements. °· Increased sucking of fingers or hands. °Late Signs of Hunger °· Fussing. °· Intermittent crying. °Extreme Signs of Hunger  °Signs of extreme hunger will require calming and consoling before your baby will be able to breastfeed successfully. Do not wait for the following signs of extreme hunger to occur before you initiate breastfeeding: °· Restlessness. °· A loud, strong cry. °· Screaming. °Breastfeeding basics ° Breastfeeding Initiation °· Find a comfortable place to sit or lie down, with your neck and back well  supported. °· Place a pillow or rolled up blanket under your baby to bring him or her to the level of your breast (if you are seated). Nursing pillows are specially designed to help support your arms and your baby while you breastfeed. °· Make sure that your baby's abdomen is facing your abdomen. °· Gently massage your breast. With your fingertips, massage from your chest wall toward your nipple in a circular motion. This encourages milk flow. You may need to continue this action during the feeding if your milk flows slowly. °· Support your breast with 4 fingers underneath and your thumb above your nipple. Make sure your fingers are well away from your nipple and your baby’s mouth. °· Stroke your baby's lips gently with your finger or nipple. °· When your baby's mouth is open wide enough, quickly bring your baby to your breast, placing your entire nipple and as much of the colored area around your nipple (areola) as possible into your baby's mouth. °¨ More areola should be visible above your baby's upper lip than below the lower lip. °¨ Your baby's tongue should be between his or her lower gum and your breast. °· Ensure that your baby's mouth is correctly positioned around your nipple (latched). Your baby's lips should create a seal on your breast and be turned out (everted). °· It is common for your baby to suck about 2-3 minutes in order to start the flow of breast milk. °Latching  °Teaching your baby how to latch on to your breast properly is very important. An improper latch can cause nipple pain and decreased milk supply for you and poor weight gain in your baby. Also, if your baby is not latched onto your nipple properly, he or she may swallow some air during feeding. This can make your baby fussy. Burping your baby when you switch breasts during the feeding can help to get rid of the air. However, teaching your baby to latch on properly is still the best way to prevent fussiness from swallowing air while  breastfeeding. °Signs that your baby has successfully latched on to your nipple: °· Silent tugging or silent sucking, without causing you pain. °· Swallowing heard between every 3-4 sucks. °· Muscle movement above and in front of his or her ears while sucking. °Signs that your baby has not successfully latched on to nipple: °· Sucking sounds or smacking sounds from your baby while breastfeeding. °· Nipple pain. °If you think your baby has not latched on correctly, slip your finger into the corner of your baby’s mouth to break the suction and place it between your baby's gums. Attempt breastfeeding initiation   again. °Signs of Successful Breastfeeding  °Signs from your baby: °· A gradual decrease in the number of sucks or complete cessation of sucking. °· Falling asleep. °· Relaxation of his or her body. °· Retention of a small amount of milk in his or her mouth. °· Letting go of your breast by himself or herself. °Signs from you: °· Breasts that have increased in firmness, weight, and size 1-3 hours after feeding. °· Breasts that are softer immediately after breastfeeding. °· Increased milk volume, as well as a change in milk consistency and color by the fifth day of breastfeeding. °· Nipples that are not sore, cracked, or bleeding. °Signs That Your Baby is Getting Enough Milk °· Wetting at least 1-2 diapers during the first 24 hours after birth. °· Wetting at least 5-6 diapers every 24 hours for the first week after birth. The urine should be clear or pale yellow by 5 days after birth. °· Wetting 6-8 diapers every 24 hours as your baby continues to grow and develop. °· At least 3 stools in a 24-hour period by age 5 days. The stool should be soft and yellow. °· At least 3 stools in a 24-hour period by age 7 days. The stool should be seedy and yellow. °· No loss of weight greater than 10% of birth weight during the first 3 days of age. °· Average weight gain of 4-7 ounces (113-198 g) per week after age 4  days. °· Consistent daily weight gain by age 5 days, without weight loss after the age of 2 weeks. °After a feeding, your baby may spit up a small amount. This is common. °Breastfeeding frequency and duration °Frequent feeding will help you make more milk and can prevent sore nipples and breast engorgement. Breastfeed when you feel the need to reduce the fullness of your breasts or when your baby shows signs of hunger. This is called "breastfeeding on demand." Avoid introducing a pacifier to your baby while you are working to establish breastfeeding (the first 4-6 weeks after your baby is born). After this time you may choose to use a pacifier. Research has shown that pacifier use during the first year of a baby's life decreases the risk of sudden infant death syndrome (SIDS). °Allow your baby to feed on each breast as long as he or she wants. Breastfeed until your baby is finished feeding. When your baby unlatches or falls asleep while feeding from the first breast, offer the second breast. Because newborns are often sleepy in the first few weeks of life, you may need to awaken your baby to get him or her to feed. °Breastfeeding times will vary from baby to baby. However, the following rules can serve as a guide to help you ensure that your baby is properly fed: °· Newborns (babies 4 weeks of age or younger) may breastfeed every 1-3 hours. °· Newborns should not go longer than 3 hours during the day or 5 hours during the night without breastfeeding. °· You should breastfeed your baby a minimum of 8 times in a 24-hour period until you begin to introduce solid foods to your baby at around 6 months of age. °Breast milk pumping °Pumping and storing breast milk allows you to ensure that your baby is exclusively fed your breast milk, even at times when you are unable to breastfeed. This is especially important if you are going back to work while you are still breastfeeding or when you are not able to be present during  feedings. Your   lactation consultant can give you guidelines on how long it is safe to store breast milk. °A breast pump is a machine that allows you to pump milk from your breast into a sterile bottle. The pumped breast milk can then be stored in a refrigerator or freezer. Some breast pumps are operated by hand, while others use electricity. Ask your lactation consultant which type will work best for you. Breast pumps can be purchased, but some hospitals and breastfeeding support groups lease breast pumps on a monthly basis. A lactation consultant can teach you how to hand express breast milk, if you prefer not to use a pump. °Caring for your breasts while you breastfeed °Nipples can become dry, cracked, and sore while breastfeeding. The following recommendations can help keep your breasts moisturized and healthy: °· Avoid using soap on your nipples. °· Wear a supportive bra. Although not required, special nursing bras and tank tops are designed to allow access to your breasts for breastfeeding without taking off your entire bra or top. Avoid wearing underwire-style bras or extremely tight bras. °· Air dry your nipples for 3-4 minutes after each feeding. °· Use only cotton bra pads to absorb leaked breast milk. Leaking of breast milk between feedings is normal. °· Use lanolin on your nipples after breastfeeding. Lanolin helps to maintain your skin's normal moisture barrier. If you use pure lanolin, you do not need to wash it off before feeding your baby again. Pure lanolin is not toxic to your baby. You may also hand express a few drops of breast milk and gently massage that milk into your nipples and allow the milk to air dry. °In the first few weeks after giving birth, some women experience extremely full breasts (engorgement). Engorgement can make your breasts feel heavy, warm, and tender to the touch. Engorgement peaks within 3-5 days after you give birth. The following recommendations can help ease  engorgement: °· Completely empty your breasts while breastfeeding or pumping. You may want to start by applying warm, moist heat (in the shower or with warm water-soaked hand towels) just before feeding or pumping. This increases circulation and helps the milk flow. If your baby does not completely empty your breasts while breastfeeding, pump any extra milk after he or she is finished. °· Wear a snug bra (nursing or regular) or tank top for 1-2 days to signal your body to slightly decrease milk production. °· Apply ice packs to your breasts, unless this is too uncomfortable for you. °· Make sure that your baby is latched on and positioned properly while breastfeeding. °If engorgement persists after 48 hours of following these recommendations, contact your health care provider or a lactation consultant. °Overall health care recommendations while breastfeeding °· Eat healthy foods. Alternate between meals and snacks, eating 3 of each per day. Because what you eat affects your breast milk, some of the foods may make your baby more irritable than usual. Avoid eating these foods if you are sure that they are negatively affecting your baby. °· Drink milk, fruit juice, and water to satisfy your thirst (about 10 glasses a day). °· Rest often, relax, and continue to take your prenatal vitamins to prevent fatigue, stress, and anemia. °· Continue breast self-awareness checks. °· Avoid chewing and smoking tobacco. Chemicals from cigarettes that pass into breast milk and exposure to secondhand smoke may harm your baby. °· Avoid alcohol and drug use, including marijuana. °Some medicines that may be harmful to your baby can pass through breast milk. It is important   to ask your health care provider before taking any medicine, including all over-the-counter and prescription medicine as well as vitamin and herbal supplements. °It is possible to become pregnant while breastfeeding. If birth control is desired, ask your health care  provider about options that will be safe for your baby. °Contact a health care provider if: °· You feel like you want to stop breastfeeding or have become frustrated with breastfeeding. °· You have painful breasts or nipples. °· Your nipples are cracked or bleeding. °· Your breasts are red, tender, or warm. °· You have a swollen area on either breast. °· You have a fever or chills. °· You have nausea or vomiting. °· You have drainage other than breast milk from your nipples. °· Your breasts do not become full before feedings by the fifth day after you give birth. °· You feel sad and depressed. °· Your baby is too sleepy to eat well. °· Your baby is having trouble sleeping. °· Your baby is wetting less than 3 diapers in a 24-hour period. °· Your baby has less than 3 stools in a 24-hour period. °· Your baby's skin or the white part of his or her eyes becomes yellow. °· Your baby is not gaining weight by 5 days of age. °Get help right away if: °· Your baby is overly tired (lethargic) and does not want to wake up and feed. °· Your baby develops an unexplained fever. °This information is not intended to replace advice given to you by your health care provider. Make sure you discuss any questions you have with your health care provider. °Document Released: 08/21/2005 Document Revised: 02/02/2016 Document Reviewed: 02/12/2013 °Elsevier Interactive Patient Education © 2017 [employer]. ° ° °

## 2017-01-27 NOTE — Discharge Summary (Signed)
OB Discharge Summary     Patient Name: Kim Robertson DOB: 12/07/1980 MRN: [id number]  Date of admission: 01/25/2017 Delivering MD: Marquette SaaLANCASTER, ABIGAIL JOSEPH   Date of discharge: 01/27/2017  Admitting diagnosis: 39WKS CTX EVERY 5MINS Intrauterine pregnancy: 3651w0d     Secondary diagnosis:  Active Problems:   Normal labor  Additional problems: A1DM     Discharge diagnosis: Term Pregnancy Delivered                                                                                                Post partum procedures:none  Augmentation: AROM  Complications: None  Hospital course:  Onset of Labor With Vaginal Delivery     36 y.o. yo [id number] at 4751w0d was admitted in Active Labor on 01/25/2017. Patient had an uncomplicated labor course as follows:  Membrane Rupture Time/Date: 8:40 PM ,01/25/2017   Intrapartum Procedures: Episiotomy: None [1]                                         Lacerations:  2nd degree [3];Perineal [11]  Patient had a delivery of a Viable infant. 01/25/2017  Information for the patient's newborn:  Raelyn Numberdougba, Boy Essie [[id number]][[id number]]  Delivery Method: Vaginal, Spontaneous Delivery (Filed from Delivery Summary)    Pateint had an uncomplicated postpartum course.  She is ambulating, tolerating a regular diet, passing flatus, and urinating well. Patient is discharged home in stable condition on 01/27/17.   Physical exam  Vitals:   01/26/17 0520 01/26/17 0826 01/26/17 1826 01/27/17 0605  BP: 129/69 (!) 114/58 124/75 (!) 96/53  Pulse: 70 67 69 65  Resp:  18 18 18   Temp: 98 F (36.7 C) 97.8 F (36.6 C) 98 F (36.7 C) 98.2 F (36.8 C)  TempSrc: Oral Oral Oral   SpO2: 100%     Weight:      Height:       General: alert, cooperative and no distress Lochia: appropriate Uterine Fundus: firm Incision: N/A DVT Evaluation: No evidence of DVT seen on physical exam. Negative Homan's sign. No cords or calf tenderness. No significant calf/ankle edema. Labs: Lab Results   Component Value Date   WBC 9.9 01/25/2017   HGB 12.2 01/25/2017   HCT 37.4 01/25/2017   MCV 82.2 01/25/2017   PLT 259 01/25/2017   CMP Latest Ref Rng & Units 09/19/2016  Glucose 65 - 99 mg/dL 89  BUN 6 - 20 mg/dL 7  Creatinine 7.840.44 - 6.961.00 mg/dL 2.950.57  Sodium 284135 - 132145 mmol/L 132(L)  Potassium 3.5 - 5.1 mmol/L 3.9  Chloride 101 - 111 mmol/L 103  CO2 22 - 32 mmol/L 22  Calcium 8.9 - 10.3 mg/dL 8.9  Total Protein 6.5 - 8.1 g/dL 6.6  Total Bilirubin 0.3 - 1.2 mg/dL 0.3  Alkaline Phos 38 - 126 U/L 64  AST 15 - 41 U/L 15  ALT 14 - 54 U/L 11(L)    Discharge instruction: per After Visit Summary and "Baby and Me Booklet".  After visit meds:  Allergies as of 01/27/2017      Reactions   Neosporin [neomycin-bacitracin Zn-polymyx] Rash      Medication List    STOP taking these medications   accu-chek multiclix lancets   acetaminophen 325 MG tablet Commonly known as:  TYLENOL   glucose blood test strip Commonly known as:  ACCU-CHEK GUIDE     TAKE these medications   ibuprofen 600 MG tablet Commonly known as:  ADVIL,MOTRIN Take 1 tablet (600 mg total) by mouth every 6 (six) hours as needed for mild pain, moderate pain or cramping.   polyethylene glycol packet Commonly known as:  MIRALAX / GLYCOLAX Take 17 g by mouth daily.   Prenatal Vitamins 0.8 MG tablet Take 1 tablet by mouth daily.         Diet: routine diet  Activity: Advance as tolerated. Pelvic rest for 6 weeks.   Outpatient follow up:4-6 wks for pp visit, will need pp 2hr GTT d/t A1DM Follow up Appt:No future appointments. Follow up Visit:No Follow-up on file.  Postpartum contraception: abstinence until IUD  Newborn Data: Live born female  Birth Weight: 7 lb 0.7 oz (3195 g) APGAR: 9, 9  Baby Feeding: br/bott Disposition:home with mother   01/27/2017 Marge Duncans, CNM

## 2017-01-27 NOTE — Lactation Note (Signed)
This note was copied from a baby's chart. Lactation Consultation Note Experienced BF mom 3rd child. Mom states baby is cluster feeding, sometimes hurts when BF. Discussed positioning and obtaining a deep latch verses shallow latch. Encouraged to call for next feeding to acess latch assisting to prevent painful latching.  Patient Name: Kim Robertson WUJWJ'XToday's Date: 01/27/2017 Reason for consult: Follow-up assessment   Maternal Data    Feeding Feeding Type: Breast Fed Length of feed: 20 min  LATCH Score/Interventions                Intervention(s): Breastfeeding basics reviewed;Support Pillows;Position options;Skin to skin     Lactation Tools Discussed/Used     Consult Status Consult Status: Follow-up Date: 01/27/17 Follow-up type: In-patient    Keashia Haskins, Diamond NickelLAURA G 01/27/2017, 4:11 AM

## 2017-02-01 ENCOUNTER — Inpatient Hospital Stay (HOSPITAL_COMMUNITY): Admission: RE | Admit: 2017-02-01 | Payer: Medicaid Other | Source: Ambulatory Visit

## 2017-02-28 ENCOUNTER — Inpatient Hospital Stay (HOSPITAL_COMMUNITY)
Admission: AD | Admit: 2017-02-28 | Discharge: 2017-02-28 | Disposition: A | Discharge status: Home / Self Care | Payer: Medicaid Other | Source: Ambulatory Visit | Attending: Obstetrics and Gynecology | Admitting: Obstetrics and Gynecology

## 2017-02-28 DIAGNOSIS — K30 Functional dyspepsia: Secondary | ICD-10-CM | POA: Diagnosis not present

## 2017-02-28 DIAGNOSIS — R110 Nausea: Secondary | ICD-10-CM | POA: Insufficient documentation

## 2017-02-28 DIAGNOSIS — K5900 Constipation, unspecified: Secondary | ICD-10-CM | POA: Diagnosis not present

## 2017-02-28 DIAGNOSIS — R109 Unspecified abdominal pain: Secondary | ICD-10-CM | POA: Diagnosis not present

## 2017-02-28 DIAGNOSIS — Z8632 Personal history of gestational diabetes: Secondary | ICD-10-CM | POA: Diagnosis not present

## 2017-02-28 LAB — COMPREHENSIVE METABOLIC PANEL
ALBUMIN: 4.3 g/dL (ref 3.5–5.0)
ALT: 15 U/L (ref 14–54)
ANION GAP: 7 (ref 5–15)
AST: 17 U/L (ref 15–41)
Alkaline Phosphatase: 90 U/L (ref 38–126)
BILIRUBIN TOTAL: 0.5 mg/dL (ref 0.3–1.2)
BUN: 14 mg/dL (ref 6–20)
CHLORIDE: 106 mmol/L (ref 101–111)
CO2: 25 mmol/L (ref 22–32)
Calcium: 8.9 mg/dL (ref 8.9–10.3)
Creatinine, Ser: 0.87 mg/dL (ref 0.44–1.00)
GFR calc Af Amer: >60 mL/min (ref >60)
GFR calc non Af Amer: >60 mL/min (ref >60)
GLUCOSE: 88 mg/dL (ref 65–99)
POTASSIUM: 3.8 mmol/L (ref 3.5–5.1)
Sodium: 138 mmol/L (ref 135–145)
TOTAL PROTEIN: 7.7 g/dL (ref 6.5–8.1)

## 2017-02-28 LAB — URINALYSIS, ROUTINE W REFLEX MICROSCOPIC
BILIRUBIN URINE: NEGATIVE
Glucose, UA: NEGATIVE mg/dL
HGB URINE DIPSTICK: NEGATIVE
KETONES UR: NEGATIVE mg/dL
Leukocytes, UA: NEGATIVE
NITRITE: NEGATIVE
Protein, ur: NEGATIVE mg/dL
SPECIFIC GRAVITY, URINE: 1.021 (ref 1.005–1.030)
pH: 5 (ref 5.0–8.0)

## 2017-02-28 LAB — POCT PREGNANCY, URINE: PREG TEST UR: NEGATIVE

## 2017-02-28 LAB — CBC
HEMATOCRIT: 42 % (ref 36.0–46.0)
Hemoglobin: 13.6 g/dL (ref 12.0–15.0)
MCH: 26.3 pg (ref 26.0–34.0)
MCHC: 32.4 g/dL (ref 30.0–36.0)
MCV: 81.1 fL (ref 78.0–100.0)
PLATELETS: 201 10*3/uL (ref 150–400)
RBC: 5.18 MIL/uL — ABNORMAL HIGH (ref 3.87–5.11)
RDW: 14.7 % (ref 11.5–15.5)
WBC: 7 10*3/uL (ref 4.0–10.5)

## 2017-02-28 MED ORDER — KETOROLAC TROMETHAMINE 30 MG/ML IJ SOLN
30.0000 mg | Freq: Once | INTRAMUSCULAR | Status: AC
  Administered 2017-02-28: 30 mg via INTRAMUSCULAR
  Filled 2017-02-28: qty 1

## 2017-02-28 MED ORDER — ONDANSETRON HCL 4 MG PO TABS
4.0000 mg | ORAL_TABLET | Freq: Once | ORAL | Status: AC
  Administered 2017-02-28: 4 mg via ORAL
  Filled 2017-02-28: qty 1

## 2017-02-28 MED ORDER — ONDANSETRON HCL 4 MG PO TABS: 4.0000 mg | ORAL_TABLET | Freq: Three times a day (TID) | ORAL | 1 refills | Status: DC | PRN

## 2017-02-28 MED ORDER — IBUPROFEN 800 MG PO TABS
800.0000 mg | ORAL_TABLET | Freq: Three times a day (TID) | ORAL | 1 refills | Status: DC | PRN
Start: 2017-02-28 — End: 2017-11-06

## 2017-02-28 NOTE — MAU Note (Signed)
Pt. Here for abdominal burning that started around 1700 today. The abd. Burning started two weeks after leaving the hospital. [facility]. Postpartum period is going "OK".

## 2017-02-28 NOTE — Discharge Instructions (Signed)
Paresthesia Paresthesia is a burning or prickling feeling. This feeling can happen in any part of the body. It often happens in the hands, arms, legs, or feet. Usually, it is not painful. In most cases, the feeling goes away in a short time and is not a sign of a serious problem. Follow these instructions at home:  Avoid drinking alcohol.  Try massage or needle therapy (acupuncture) to help with your problems.  Keep all follow-up visits as told by your doctor. This is important. Contact a doctor if:  You keep on having episodes of paresthesia.  Your burning or prickling feeling gets worse when you walk.  You have pain or cramps.  You feel dizzy.  You have a rash. Get help right away if:  You feel weak.  You have trouble walking or moving.  You have problems speaking, understanding, or seeing.  You feel confused.  You cannot control when you pee (urinate) or poop (bowel movement).  You lose feeling (numbness) after an injury.  You pass out (faint). This information is not intended to replace advice given to you by your health care provider. Make sure you discuss any questions you have with your health care provider. Document Released: 08/03/2008 Document Revised: 01/27/2016 Document Reviewed: 08/17/2014 Elsevier Interactive Patient Education  2018 Elsevier Inc.  

## 2017-02-28 NOTE — MAU Provider Note (Signed)
  History     CSN: [id number]  Arrival date and time: 02/28/17 [id number]   First Provider Initiated Contact with Patient 02/28/17 1940      Chief Complaint  Patient presents with  . Abdominal Pain    Buring sensation   HPI 36 yo s/p TSVD on 01/25/17 [facility] to [facility] with c/o abd burning sensation that started @ 1700 today. Has had similar sensation off/on since discharge note but worsen today. Has not taken any medication for the pain.  She does report problems with constipation but responds well to Miralax. Had BM today Reports bleeding has stopped for the most part. Denies any fever or chills. Some nausea with the pain. She is breast and bottle feeding.   Prenatal course was complicated by GDM.    Past Medical History:  Diagnosis Date  . Gestational diabetes   . Preterm labor     Past Surgical History:  Procedure Laterality Date  . NO PAST SURGERIES      No family history on file.  Social History  Substance Use Topics  . Smoking status: Never Smoker  . Smokeless tobacco: Never Used  . Alcohol use No    Allergies:  Allergies  Allergen Reactions  . Neosporin [Neomycin-Bacitracin Zn-Polymyx] Rash    Prescriptions Prior to Admission  Medication Sig Dispense Refill Last Dose  . ibuprofen (ADVIL,MOTRIN) 600 MG tablet Take 1 tablet (600 mg total) by mouth every 6 (six) hours as needed for mild pain, moderate pain or cramping. 30 tablet 0   . polyethylene glycol (MIRALAX / GLYCOLAX) packet Take 17 g by mouth daily.   Past Week at Unknown time  . Prenatal Multivit-Min-Fe-FA (PRENATAL VITAMINS) 0.8 MG tablet Take 1 tablet by mouth daily. 30 tablet 12 01/26/2017 at Unknown time    Review of Systems  Constitutional: Negative.   Respiratory: Negative.   Cardiovascular: Negative.   Gastrointestinal: Positive for abdominal pain, constipation and nausea.  Genitourinary: Negative.    Physical Exam   Blood pressure 123/69, pulse (!) 51, temperature 98.3 F (36.8 C),  temperature source Oral, resp. rate 17, height 5\' 3"  (1.6 m), weight 77.6 kg (171 lb), SpO2 100 %, unknown if currently breastfeeding.  Physical Exam  Constitutional: She appears well-developed and well-nourished.  No acute distress  Cardiovascular: Normal rate, regular rhythm and normal heart sounds.   Respiratory: Effort normal and breath sounds normal.  GI: Soft. Bowel sounds are normal. She exhibits no distension and no mass. There is no tenderness. There is no rebound and no guarding.    MAU Course  Procedures Labs drawn, normal Pt given Toradol and Zofran with complete resolution of her Sx    Assessment and Plan  Burning sensation  Not sure of etiology but suspect possible related to constipation. Will d/c home with Rx for Motrin and Zofran. Continue with Miralax as needed and keep PP visit next week.  Hermina StaggersMichael L Corianne Buccellato 02/28/2017, 8:13 PM

## 2017-02-28 NOTE — MAU Note (Signed)
Pt. Discharged to home, verbalized understanding of all discharge and prescription instructions.  Pt. Signed hard copy of discharge instructions, placed with file and sent to medical records..nurse unable to obtain e-signature - system down in WH#1.

## 2017-02-28 NOTE — MAU Note (Signed)
Pt. Currently eating saltine crackers and peanut butter, ice water given..the patient tolerated without emesis.

## 2017-03-05 ENCOUNTER — Encounter: Payer: Self-pay | Admitting: Obstetrics and Gynecology

## 2017-03-05 ENCOUNTER — Ambulatory Visit (INDEPENDENT_AMBULATORY_CARE_PROVIDER_SITE_OTHER): Payer: Medicaid Other | Admitting: Obstetrics and Gynecology

## 2017-03-05 ENCOUNTER — Encounter: Payer: Self-pay | Admitting: *Deleted

## 2017-03-05 VITALS — BP 122/78 | Wt 170.0 lb

## 2017-03-05 DIAGNOSIS — Z30430 Encounter for insertion of intrauterine contraceptive device: Secondary | ICD-10-CM | POA: Diagnosis not present

## 2017-03-05 DIAGNOSIS — O0993 Supervision of high risk pregnancy, unspecified, third trimester: Secondary | ICD-10-CM

## 2017-03-05 DIAGNOSIS — F53 Postpartum depression: Secondary | ICD-10-CM

## 2017-03-05 DIAGNOSIS — O99345 Other mental disorders complicating the puerperium: Principal | ICD-10-CM

## 2017-03-05 MED ORDER — PARAGARD INTRAUTERINE COPPER IU IUD
INTRAUTERINE_SYSTEM | Freq: Once | INTRAUTERINE | Status: AC
  Administered 2017-03-05: 17:00:00 via INTRAUTERINE

## 2017-03-05 NOTE — Progress Notes (Signed)
Subjective:     Kim Robertson is a 36 y.o. female who presents for a postpartum visit. She is 4 weeks postpartum following a spontaneous vaginal delivery. I have fully reviewed the prenatal and intrapartum course. The delivery was at 39 gestational weeks. Outcome: spontaneous vaginal delivery. Anesthesia: IV sedation. Postpartum course has been uncomplicated. Baby's course has been uncomplicated. Baby is feeding by breast. Bleeding scant. Bowel function is consitpation; Getting relief with Colace. . Bladder function is normal. Patient is not sexually active. Contraception method is NONE wants Paragard. Postpartum depression screening: positive.     Review of Systems Pertinent items are noted in HPI.   Objective:    There were no vitals taken for this visit.  General:  alert, cooperative and no distress   Breasts:  inspection negative, no nipple discharge or bleeding, no masses or nodularity palpable  Lungs: clear to auscultation bilaterally  Heart:  regular rate and rhythm  Abdomen: soft, non-tender; bowel sounds normal; no masses,  no organomegaly   Vulva:  normal  Vagina: normal vagina, no discharge, exudate, lesion, or erythema  Cervix:  multiparous appearance  Corpus: normal size, contour, position, consistency, mobility, non-tender  Adnexa:  no mass, fullness, tenderness  Rectal Exam: Not performed.        Assessment:     Normal postpartum exam. Pap smear not done at today's visit.  Neg pap/HPV detected 10/02/16  Plan:    1. Contraception: IUD  IUD Procedure Note Patient identified, informed consent performed, signed copy in chart, time out was performed.  Urine pregnancy test negative.  Speculum placed in the vagina.  Cervix visualized.  Cleaned with Betadine x 2.  Grasped anteriorly with a single tooth tenaculum.  Uterus sounded to 9 cm.  Paraguard IUD placed per manufacturer's recommendations.  Strings trimmed to 3 cm. Tenaculum was removed, good hemostasis noted.  Patient  tolerated procedure well.   Patient given post procedure instructions and paraguard care card with expiration date.  Patient is asked to check IUD strings periodically and follow up in 4-6 weeks for IUD check.   2. Patient is meducally cleared to resume all activities of daily living. Patient with positive screen for depression. She denies suicidal/homicidal ideation. She admits to being overwhelmed in carrying for her 3 children aged 4, 1.5 and the newborn. Will refer to see Asher MuirJamie behavioral health specialist. Patient will call us if interested in medical therapy  3. Follow up in: 4 weeks for IUD check or as needed.

## 2017-03-15 ENCOUNTER — Institutional Professional Consult (permissible substitution): Payer: Medicaid Other

## 2017-03-15 NOTE — BH Specialist Note (Deleted)
Integrated Behavioral Health Initial Visit  MRN: [id number] Name: Kim Robertson   Session Start time: *** Session End time: *** Total time: {IBH Total Time:21014050}  Type of Service: Integrated Behavioral Health- Individual/Family Interpretor:No. Interpretor Name and Language: n/a   Warm Hand Off Completed.       SUBJECTIVE: Kim Robertson is a 36 y.o. female accompanied by {Persons; PED relatives w/patient:19415}. Patient was referred by Dr Jolayne Pantheronstant for depression. Patient reports the following symptoms/concerns: *** Duration of problem: ***; Severity of problem: {Mild/Moderate/Severe:20260}  OBJECTIVE: Mood: {BHH MOOD:22306} and Affect: {BHH AFFECT:22307} Risk of harm to self or others: {CHL AMB BH Suicide Current Mental Status:21022748}   LIFE CONTEXT: Family and Social: *** School/Work: *** Self-Care: *** Life Changes: ***  GOALS ADDRESSED: Patient will reduce symptoms of: {IBH Symptoms:21014056} and increase knowledge and/or ability of: {IBH Patient Tools:21014057} and also: {IBH Goals:21014053}   INTERVENTIONS: {IBH Interventions:21014054}  Standardized Assessments completed: {IBH Screening Tools:21014051}  ASSESSMENT: Patient currently experiencing ***. Patient may benefit from ***.  PLAN: 1. Follow up with behavioral health clinician on : *** 2. Behavioral recommendations: *** 3. Referral(s): {IBH Referrals:21014055} 4. "From scale of 1-10, how likely are you to follow plan?": ***  Jamie C McMannes, LCSWA

## 2017-04-02 ENCOUNTER — Ambulatory Visit (INDEPENDENT_AMBULATORY_CARE_PROVIDER_SITE_OTHER): Payer: Medicaid Other | Admitting: Obstetrics and Gynecology

## 2017-04-02 ENCOUNTER — Encounter: Payer: Self-pay | Admitting: Obstetrics and Gynecology

## 2017-04-02 VITALS — BP 115/70 | HR 66 | Wt 167.0 lb

## 2017-04-02 DIAGNOSIS — Z30431 Encounter for routine checking of intrauterine contraceptive device: Secondary | ICD-10-CM | POA: Diagnosis not present

## 2017-04-02 NOTE — Progress Notes (Signed)
36 yo G3P2103 here for IUD check. Patient had a Paraguard IUD inserted on 7/2. She reports feeling well and denies any pelvic pain or pain with intercourse.  Past Medical History:  Diagnosis Date  . Gestational diabetes   . Preterm labor    Past Surgical History:  Procedure Laterality Date  . NO PAST SURGERIES     No family history on file. Social History  Substance Use Topics  . Smoking status: Never Smoker  . Smokeless tobacco: Never Used  . Alcohol use No   ROS See pertinent in HPI  Blood pressure 115/70, pulse 66, weight 167 lb (75.8 kg), unknown if currently breastfeeding.  GENERAL: Well-developed, well-nourished female in no acute distress.  ABDOMEN: Soft, nontender, nondistended.  PELVIC: Normal external female genitalia. Vagina is pink and rugated.  Normal discharge. Normal appearing cervix with IUD strings extending 2.5 cm from external os. Uterus is normal in size.  No adnexal mass or tenderness. EXTREMITIES: No cyanosis, clubbing, or edema, 2+ distal pulses.  A/P 36 yo here for string check - IUD appears to be in the appropriate location.  - Normal pap smear in 09/2016 - RTC in a few days for fasting 2 hour glucola

## 2017-10-23 ENCOUNTER — Encounter: Payer: Self-pay | Admitting: Family Medicine

## 2017-11-05 ENCOUNTER — Encounter: Payer: Self-pay | Admitting: Family Medicine

## 2017-11-05 NOTE — Progress Notes (Signed)
   Subjective:   Patient ID: Kim Robertson    DOB: [birth year], 37 y.o. female   MRN: [id number]  Kim Lawlesshoefa Desjardin is a 37 y.o. female with a history of GDM here for   Establish Care - problem list and medications reviewed. Wants labs for "full check up" - [facility] care, IUD placed 03/2017 after last delivery. - flow is heavier than usual - Since paraguard placed in July, soaking pad and underwear, uses about 3 pads per day. Changing pad during the night. Having periods every month regularly, lasts 4 days. - Before IUD placement had regular, normal flow, no cramps, 3-4 days, once per month. - endorsing fatigue with periods.  Review of Systems:  Per HPI.   PMFSH: reviewed. Smoking status reviewed. Medications reviewed.  Objective:   BP 100/60   Pulse 67   Temp 98.1 F (36.7 C) (Oral)   Ht 5' 5.15" (1.655 m)   Wt 173 lb (78.5 kg)   LMP 11/04/2017   SpO2 99%   BMI 28.66 kg/m  Vitals and nursing note reviewed.  General: well nourished, well developed, in no acute distress with non-toxic appearance HEENT: normocephalic, atraumatic, moist mucous membranes Neck: supple, non-tender without lymphadenopathy CV: regular rate and rhythm without murmurs, rubs, or gallops, no lower extremity edema Lungs: clear to auscultation bilaterally with normal work of breathing Abdomen: soft, non-tender, non-distended, no masses or organomegaly palpable, normoactive bowel sounds Skin: warm, dry, no rashes or lesions Extremities: warm and well perfused, normal tone MSK: ROM grossly intact, strength intact, gait normal Neuro: Alert and oriented, speech normal  Assessment & Plan:   Heavy menstrual bleeding Noted since Paraguard placement in 03/2017. Counseled that patient may see difference with progesterone IUD but patient does not want hormonal IUD at this time. Encouraged patient to follow up with OB should she change her mind. As patient is symptomatic with blood loss, will  obtain CBC and iron studies. Counseled on iron-rich diet.  Orders Placed This Encounter  Procedures  . CBC with Differential  . Comprehensive metabolic panel  . Iron and TIBC([facility])  . Ferritin   No orders of the defined types were placed in this encounter.  Ellwood DenseAlison Rumball, DO PGY-1, [facility] 11/06/2017 12:02 PM

## 2017-11-06 ENCOUNTER — Encounter: Payer: Self-pay | Admitting: Family Medicine

## 2017-11-06 ENCOUNTER — Other Ambulatory Visit: Payer: Self-pay

## 2017-11-06 ENCOUNTER — Ambulatory Visit (INDEPENDENT_AMBULATORY_CARE_PROVIDER_SITE_OTHER): Payer: Medicaid Other | Admitting: Family Medicine

## 2017-11-06 VITALS — BP 100/60 | HR 67 | Temp 98.1°F | Ht 65.15 in | Wt 173.0 lb

## 2017-11-06 DIAGNOSIS — N920 Excessive and frequent menstruation with regular cycle: Secondary | ICD-10-CM | POA: Diagnosis present

## 2017-11-06 DIAGNOSIS — Z0000 Encounter for general adult medical examination without abnormal findings: Secondary | ICD-10-CM

## 2017-11-06 DIAGNOSIS — R5383 Other fatigue: Secondary | ICD-10-CM

## 2017-11-06 NOTE — Assessment & Plan Note (Signed)
Noted since Paraguard placement in 03/2017. Counseled that patient may see difference with progesterone IUD but patient does not want hormonal IUD at this time. Encouraged patient to follow up with OB should she change her mind. As patient is symptomatic with blood loss, will obtain CBC and iron studies. Counseled on iron-rich diet.

## 2017-11-06 NOTE — Patient Instructions (Signed)
It was great to see you!  For your heavy bleeding,  - You can try ibuprofen for pain and bleeding. - Progestrone IUDs may have less bleeding associated with them, if you are interested in replacing your current IUD, you can make an appointment with your OB doctor to replace. - We are checking some labs today, we will call you or send you a letter if they are abnormal.  - I attached some information about an iron-rich diet.  Take care and seek immediate care sooner if you develop any concerns.   Rory Percy, DO [facility]   Iron-Rich Diet Iron is a mineral that helps your body to produce hemoglobin. Hemoglobin is a protein in your red blood cells that carries oxygen to your body's tissues. Eating too little iron may cause you to feel weak and tired, and it can increase your risk for infection. Eating enough iron is necessary for your body's metabolism, muscle function, and nervous system. Iron is naturally found in many foods. It can also be added to foods or fortified in foods. There are two types of dietary iron:  Heme iron. Heme iron is absorbed by the body more easily than nonheme iron. Heme iron is found in meat, poultry, and fish.  Nonheme iron. Nonheme iron is found in dietary supplements, iron-fortified grains, beans, and vegetables.  You may need to follow an iron-rich diet if:  You have been diagnosed with iron deficiency or iron-deficiency anemia.  You have a condition that prevents you from absorbing dietary iron, such as: ? Infection in your intestines. ? Celiac disease. This involves long-lasting (chronic) inflammation of your intestines.  You do not eat enough iron.  You eat a diet that is high in foods that impair iron absorption.  You have lost a lot of blood.  You have heavy bleeding during your menstrual cycle.  You are pregnant.  What is my plan? Your health care provider may help you to determine how much iron you need per day based on your  condition. Generally, when a person consumes sufficient amounts of iron in the diet, the following iron needs are met:  Men. ? 62-50 years old: 11 mg per day. ? 40-91 years old: 8 mg per day.  Women. ? 31-48 years old: 15 mg per day. ? 22-65 years old: 18 mg per day. ? Over 41 years old: 8 mg per day. ? Pregnant women: 27 mg per day. ? Breastfeeding women: 9 mg per day.  What do I need to know about an iron-rich diet?  Eat fresh fruits and vegetables that are high in vitamin C along with foods that are high in iron. This will help increase the amount of iron that your body absorbs from food, especially with foods containing nonheme iron. Foods that are high in vitamin C include oranges, peppers, tomatoes, and mango.  Take iron supplements only as directed by your health care provider. Overdose of iron can be life-threatening. If you were prescribed iron supplements, take them with orange juice or a vitamin C supplement.  Cook foods in pots and pans that are made from iron.  Eat nonheme iron-containing foods alongside foods that are high in heme iron. This helps to improve your iron absorption.  Certain foods and drinks contain compounds that impair iron absorption. Avoid eating these foods in the same meal as iron-rich foods or with iron supplements. These include: ? Coffee, black tea, and red wine. ? Milk, dairy products, and foods that are  high in calcium. ? Beans, soybeans, and peas. ? Whole grains.  When eating foods that contain both nonheme iron and compounds that impair iron absorption, follow these tips to absorb iron better. ? Soak beans overnight before cooking. ? Soak whole grains overnight and drain them before using. ? Ferment flours before baking, such as using yeast in bread dough. What foods can I eat? Grains Iron-fortified breakfast cereal. Iron-fortified whole-wheat bread. Enriched rice. Sprouted grains. Vegetables Spinach. Potatoes with skin. Green peas.  Broccoli. Red and green bell peppers. Fermented vegetables. Fruits Prunes. Raisins. Oranges. Strawberries. Mango. Grapefruit. Meats and Other Protein Sources Beef liver. Oysters. Beef. Shrimp. [place]. Chicken. [place]. Sardines. Chickpeas. Nuts. Tofu. Beverages Tomato juice. Fresh orange juice. Prune juice. Hibiscus tea. Fortified instant breakfast shakes. Condiments Tahini. Fermented soy sauce. Sweets and Desserts Black-strap molasses. Other Wheat germ. The items listed above may not be a complete list of recommended foods or beverages. Contact your dietitian for more options. What foods are not recommended? Grains Whole grains. Bran cereal. Bran flour. Oats. Vegetables Artichokes. Brussels sprouts. Kale. Fruits Blueberries. Raspberries. Strawberries. Figs. Meats and Other Protein Sources Soybeans. Products made from soy protein. Dairy Milk. Cream. Cheese. Yogurt. Cottage cheese. Beverages Coffee. Black tea. Red wine. Sweets and Desserts Cocoa. Chocolate. Ice cream. Other Basil. Oregano. Parsley. The items listed above may not be a complete list of foods and beverages to avoid. Contact your dietitian for more information. This information is not intended to replace advice given to you by your health care provider. Make sure you discuss any questions you have with your health care provider. Document Released: 04/04/2005 Document Revised: 03/10/2016 Document Reviewed: 03/18/2014 Elsevier Interactive Patient Education  [employer].

## 2017-11-07 LAB — COMPREHENSIVE METABOLIC PANEL
ALBUMIN: 4.3 g/dL (ref 3.5–5.5)
ALT: 13 IU/L (ref 0–32)
AST: 16 IU/L (ref 0–40)
Albumin/Globulin Ratio: 1.4 (ref 1.2–2.2)
Alkaline Phosphatase: 78 IU/L (ref 39–117)
BUN / CREAT RATIO: 16 (ref 9–23)
BUN: 12 mg/dL (ref 6–20)
Bilirubin Total: <0.2 mg/dL (ref 0.0–1.2)
CALCIUM: 9.4 mg/dL (ref 8.7–10.2)
CO2: 23 mmol/L (ref 20–29)
Chloride: 105 mmol/L (ref 96–106)
Creatinine, Ser: 0.73 mg/dL (ref 0.57–1.00)
GFR, EST AFRICAN AMERICAN: 124 mL/min/{1.73_m2} (ref >59)
GFR, EST NON AFRICAN AMERICAN: 108 mL/min/{1.73_m2} (ref >59)
Globulin, Total: 3 g/dL (ref 1.5–4.5)
Glucose: 88 mg/dL (ref 65–99)
POTASSIUM: 4.5 mmol/L (ref 3.5–5.2)
Sodium: 141 mmol/L (ref 134–144)
TOTAL PROTEIN: 7.3 g/dL (ref 6.0–8.5)

## 2017-11-07 LAB — CBC WITH DIFFERENTIAL/PLATELET
BASOS: 1 %
Basophils Absolute: 0 10*3/uL (ref 0.0–0.2)
EOS (ABSOLUTE): 0.2 10*3/uL (ref 0.0–0.4)
EOS: 4 %
HEMATOCRIT: 38.2 % (ref 34.0–46.6)
HEMOGLOBIN: 12.4 g/dL (ref 11.1–15.9)
IMMATURE GRANS (ABS): 0 10*3/uL (ref 0.0–0.1)
IMMATURE GRANULOCYTES: 0 %
LYMPHS: 43 %
Lymphocytes Absolute: 2.5 10*3/uL (ref 0.7–3.1)
MCH: 27 pg (ref 26.6–33.0)
MCHC: 32.5 g/dL (ref 31.5–35.7)
MCV: 83 fL (ref 79–97)
MONOCYTES: 6 %
Monocytes Absolute: 0.4 10*3/uL (ref 0.1–0.9)
NEUTROS PCT: 46 %
Neutrophils Absolute: 2.7 10*3/uL (ref 1.4–7.0)
Platelets: 288 10*3/uL (ref 150–379)
RBC: 4.6 x10E6/uL (ref 3.77–5.28)
RDW: 13.6 % (ref 12.3–15.4)
WBC: 5.8 10*3/uL (ref 3.4–10.8)

## 2017-11-07 LAB — IRON AND TIBC
IRON SATURATION: 13 % — AB (ref 15–55)
IRON: 45 ug/dL (ref 27–159)
Total Iron Binding Capacity: 344 ug/dL (ref 250–450)
UIBC: 299 ug/dL (ref 131–425)

## 2017-11-07 LAB — FERRITIN: Ferritin: 37 ng/mL (ref 15–150)

## 2017-11-12 IMAGING — US US MFM OB TRANSVAGINAL
2 series · 14 of 28 positions shown · non-contrast
Comparison: none

[Series 1: us mfm ob transvaginal · 87 acquisitions, 12 frames shown (1 of 2)]
[im 4/87]
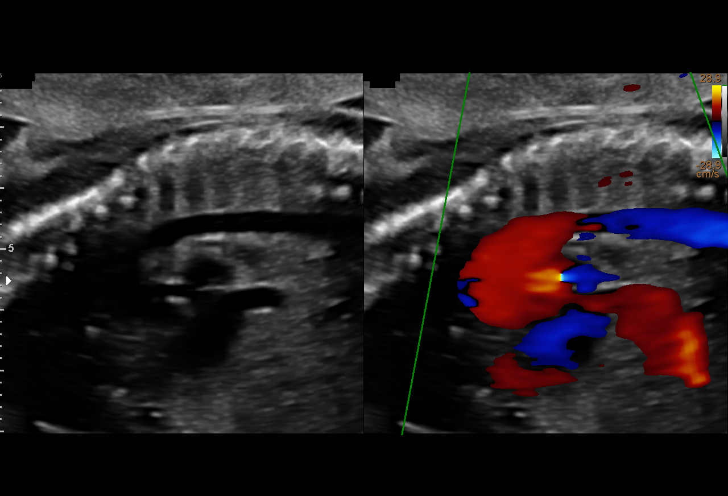
[im 11/87]
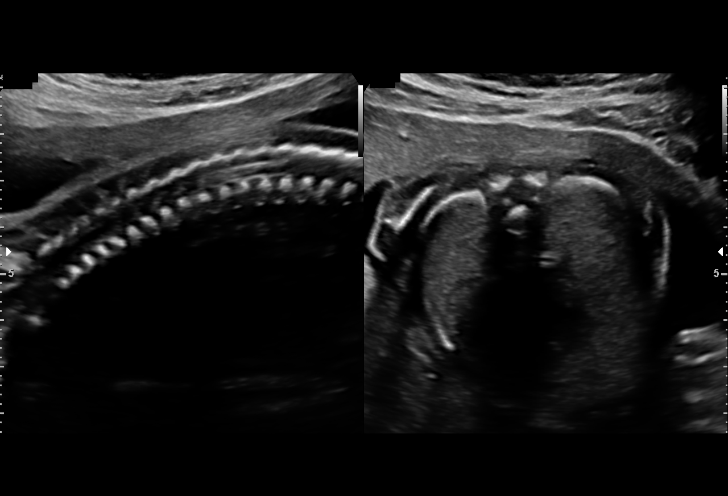
[im 18/87]
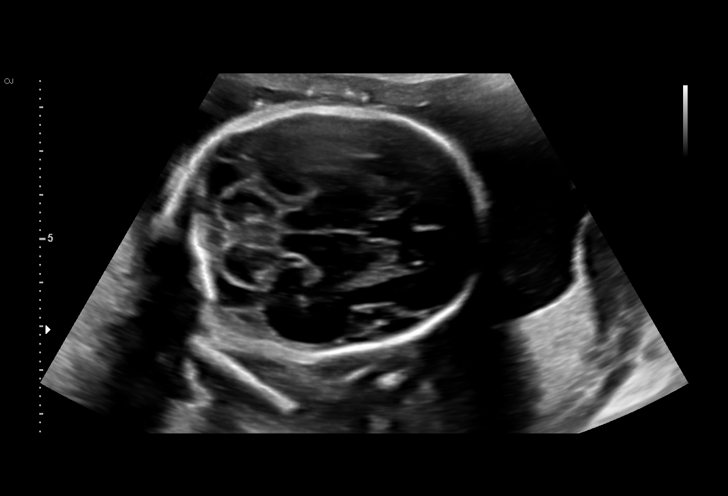
[im 26/87]
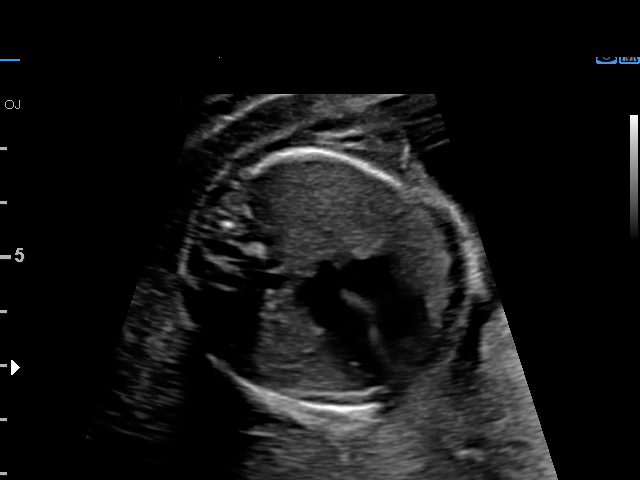
[im 33/87]
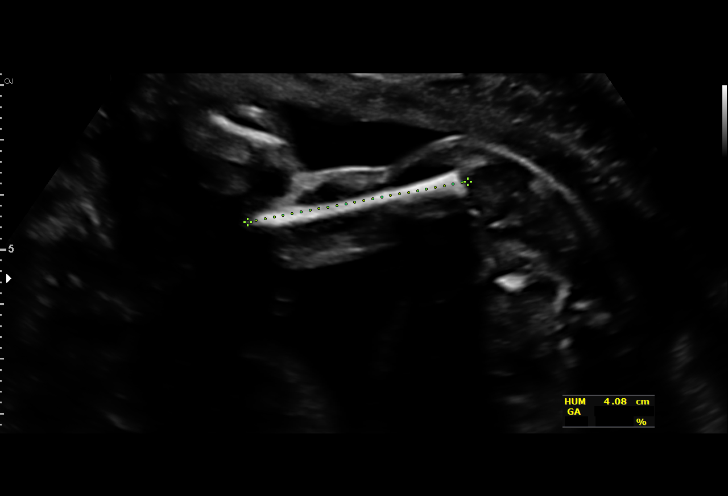
[im 40/87]
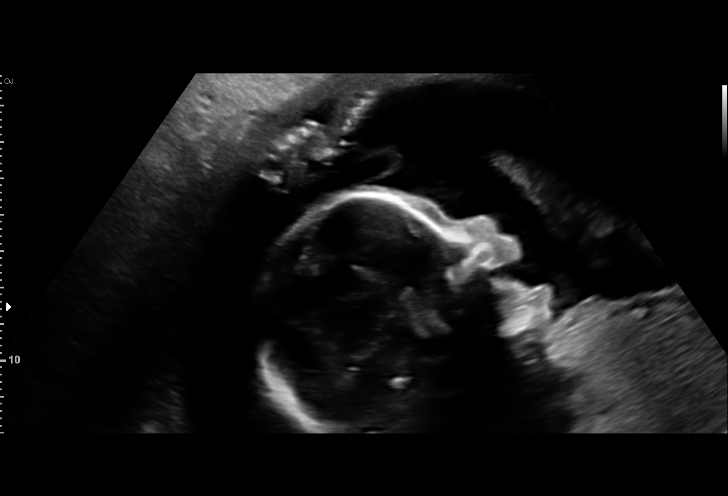
[im 47/87]
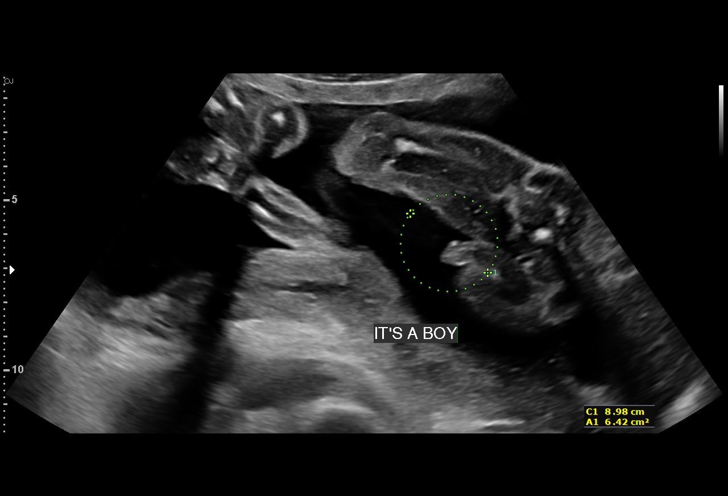
[im 54/87]
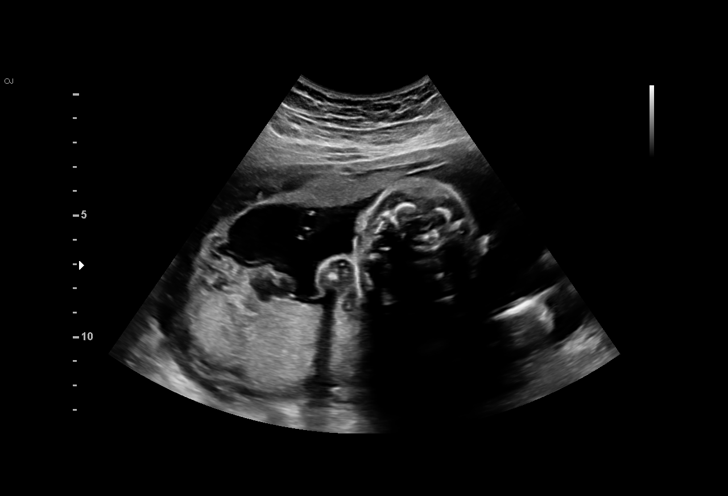
[im 61/87]
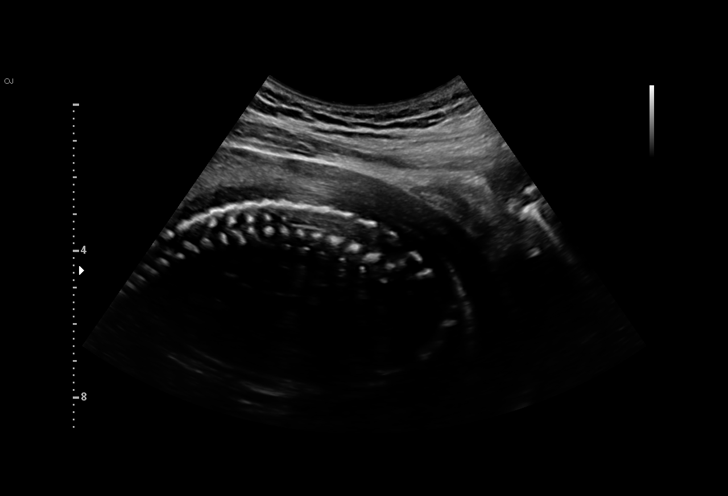
[im 69/87]
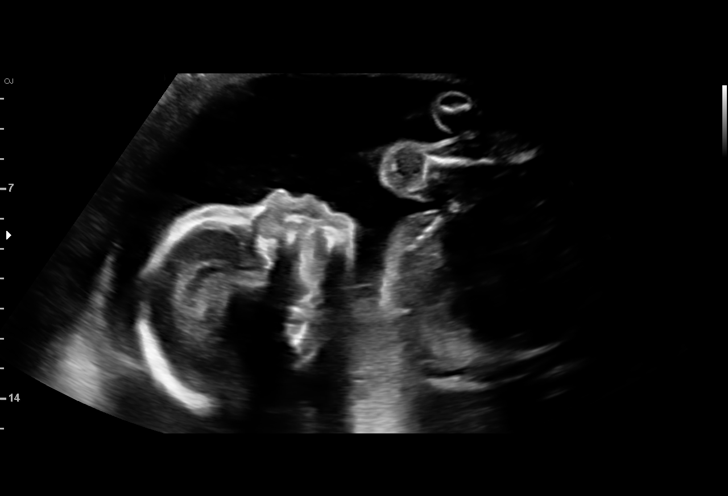
[im 76/87]
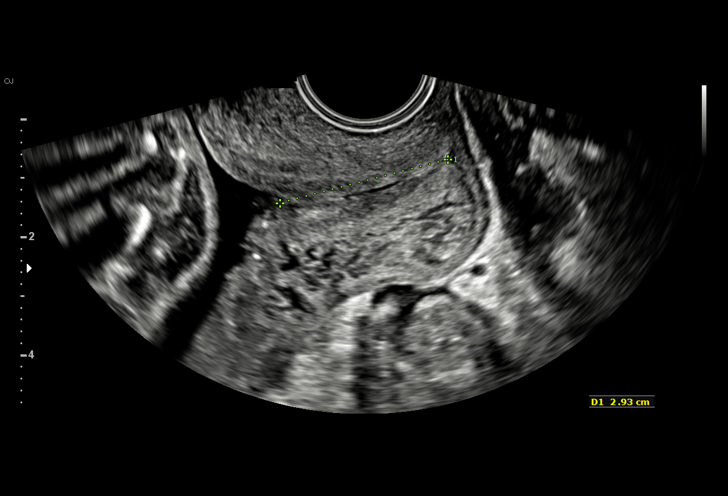
[im 83/87]
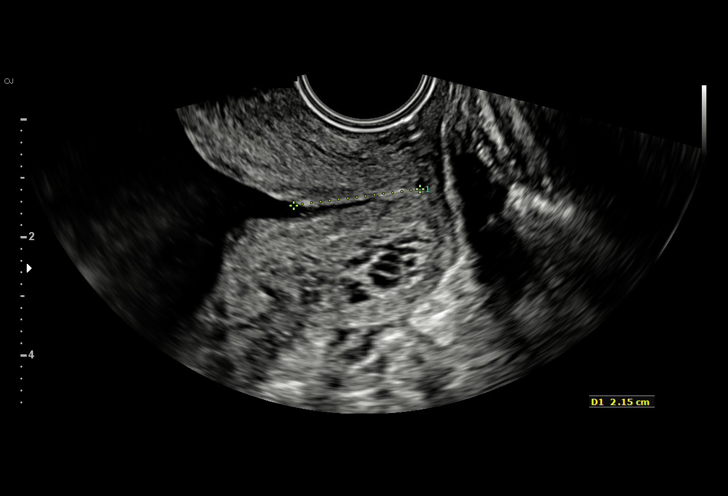

[Series 3: us mfm ob transvaginal · 9 acquisitions, 2 frames shown (2 of 2)]
[im 1/9]
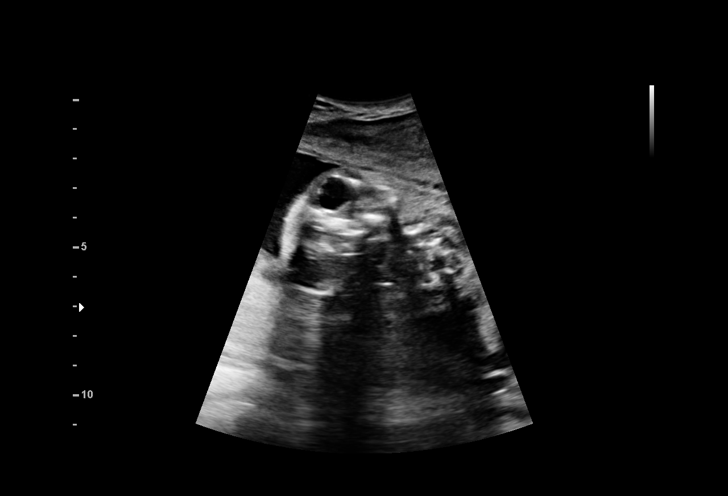
[im 9/9]
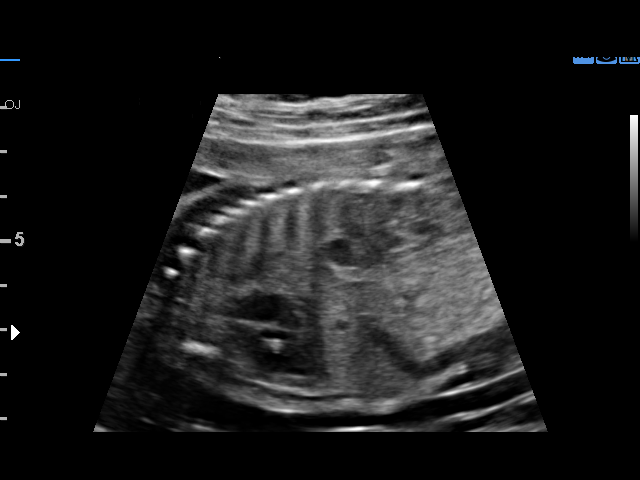

[14 of 28 positions shown; findings below may reference images not displayed]

pm)


1  RAJABABU OORAKAM            [id number]      [id number]     [id number]
2  RAJABABU OORAKAM            [id number]      [id number]     [id number]
Indications

23 weeks gestation of pregnancy
Poor obstetric history: Previous preterm
delivery due to PROM (27 weeks)
Encounter for uncertain dates
Encounter for antenatal screening for
malformations
Obesity complicating pregnancy, second
trimester
OB History

Blood Type:            Height:         Weight (lb):  196      BMI:
Gravidity:    3         Term:   1        Prem:   1        SAB:   0
TOP:          0       Ectopic:  0        Living: 2
Fetal Evaluation

Num Of Fetuses:     1
Fetal Heart         153
Rate(bpm):
Cardiac Activity:   Observed
Presentation:       Breech
Placenta:           Posterior, above cervical os
P. Cord Insertion:  Visualized

Amniotic Fluid
AFI FV:      Subjectively within normal limits

Largest Pocket(cm)
5.56
Biometry

BPD:      57.1  mm     G. Age:  23w 3d         29  %    CI:        73.57   %   70 - 86
FL/HC:      20.5   %   18.7 -
HC:      211.5  mm     G. Age:  23w 2d         14  %    HC/AC:      1.07       1.05 -
AC:      198.1  mm     G. Age:  24w 4d         61  %    FL/BPD:     75.8   %   71 - 87
FL:       43.3  mm     G. Age:  24w 2d         49  %    FL/AC:      21.9   %   20 - 24
HUM:      40.3  mm     G. Age:  24w 3d         57  %
CER:      27.3  mm     G. Age:  24w 5d         67  %
CM:        3.8  mm

Est. FW:     670  gm      1 lb 8 oz     58  %
Gestational Age

LMP:           18w 3d       Date:   06/04/16                 EDD:   03/11/17
U/S Today:     23w 6d                                        EDD:   02/01/17
Best:          23w 6d    Det. By:   U/S (10/11/16)           EDD:   02/01/17
Anatomy

Cranium:               Appears normal         Aortic Arch:            Appears normal
Cavum:                 Appears normal         Ductal Arch:            Appears normal
Ventricles:            Appears normal         Diaphragm:              Appears normal
Choroid Plexus:        Appears normal         Stomach:                Appears normal, left
sided
Cerebellum:            Appears normal         Abdomen:                Appears normal
Posterior Fossa:       Appears normal         Abdominal Wall:         Appears nml (cord
insert, abd wall)
Nuchal Fold:           Not applicable (>20    Cord Vessels:           Appears normal (3
wks GA)                                        vessel cord)
Face:                  Appears normal         Kidneys:                Appear normal
(orbits and profile)
Lips:                  Appears normal         Bladder:                Appears normal
Thoracic:              Appears normal         Spine:                  Appears normal
Heart:                 Appears normal         Upper Extremities:      Appears normal
(4CH, axis, and
situs)
RVOT:                  Appears normal         Lower Extremities:      Appears normal
LVOT:                  Appears normal

Other:  Male gender. Technically difficult due to fetal position.
Cervix Uterus Adnexa

Cervix
dynamic Marah 3.0-2.2

Uterus
No abnormality visualized.

Left Ovary
No adnexal mass visualized.

Right Ovary
No adnexal mass visualized.
Impression

SIUP at 23+6 weeks
Normal detailed fetal anatomy
Normal amniotic fluid volume
EDC based on today's measurements: 02/01/17
EV views of cervix: dynamic mild funneling with the distal
closed portion measuring 2.2 - 3.0 cms
Recommendations

Vaginal Flock nightly (pt couldn't tolerate vaginal
progesterone last pregnancy and switched to weekly 17P, but
she wanted to try vaginal route again)
CL in 2 and 4 weeks

## 2018-02-12 IMAGING — US US MFM OB FOLLOW-UP
1 series · 14 of 28 positions shown · non-contrast
Comparison: none

[Series 1: us mfm ob follow-up · 40 acquisitions, 14 frames shown]
[im 2/40]
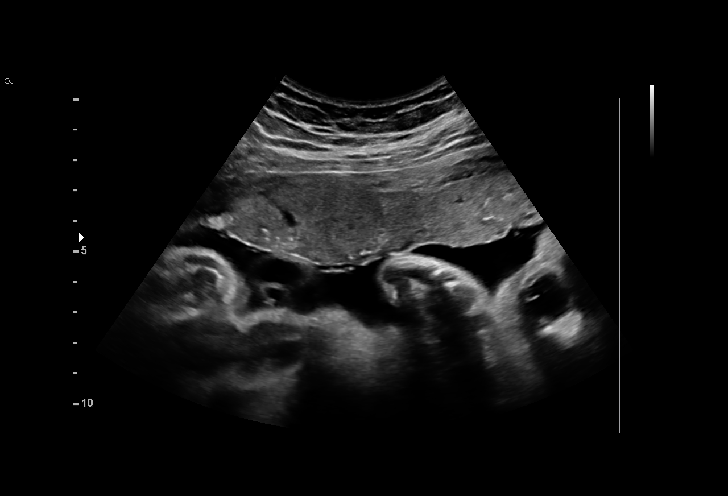
[im 5/40]
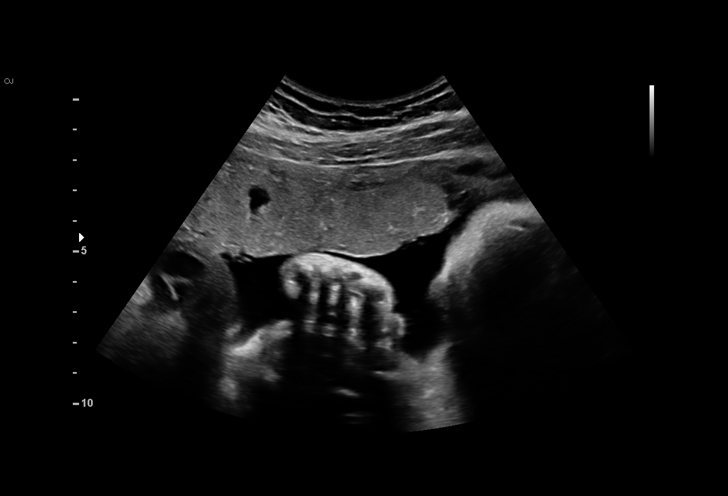
[im 8/40]
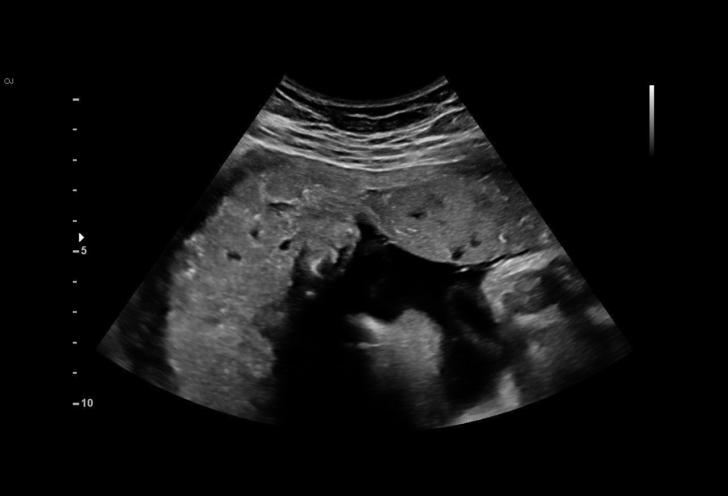
[im 11/40]
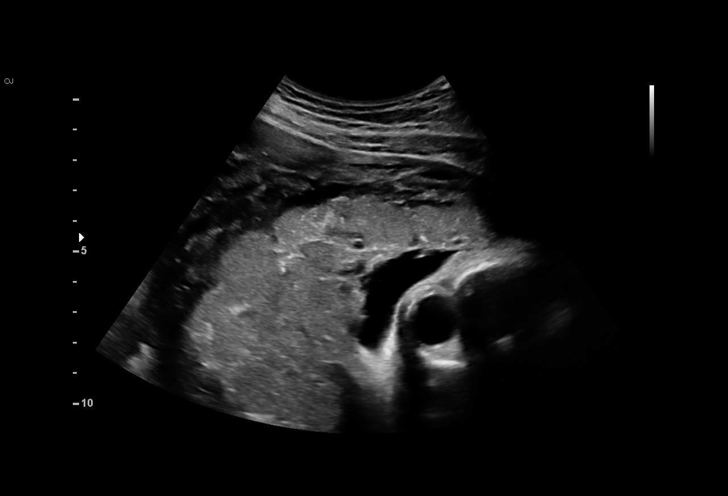
[im 14/40]
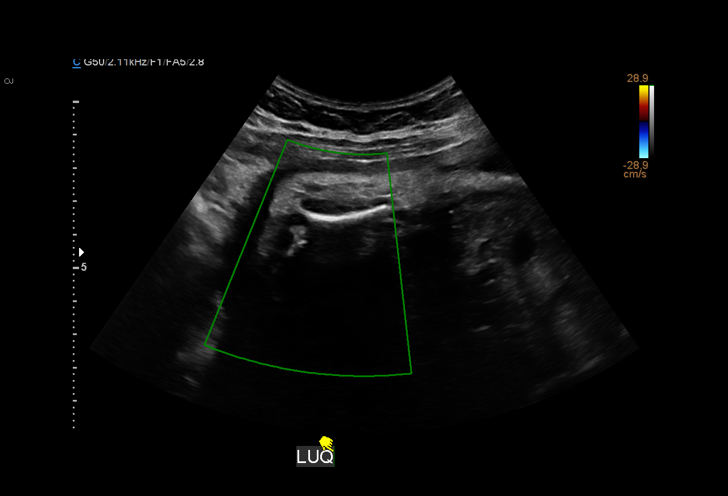
[im 16/40]
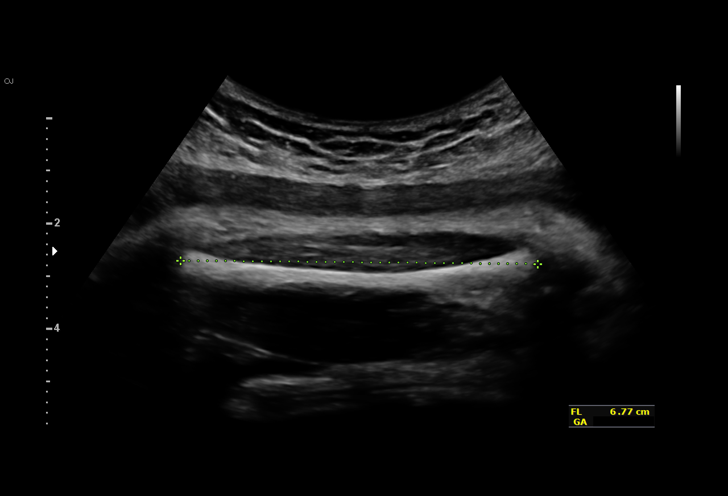
[im 19/40]
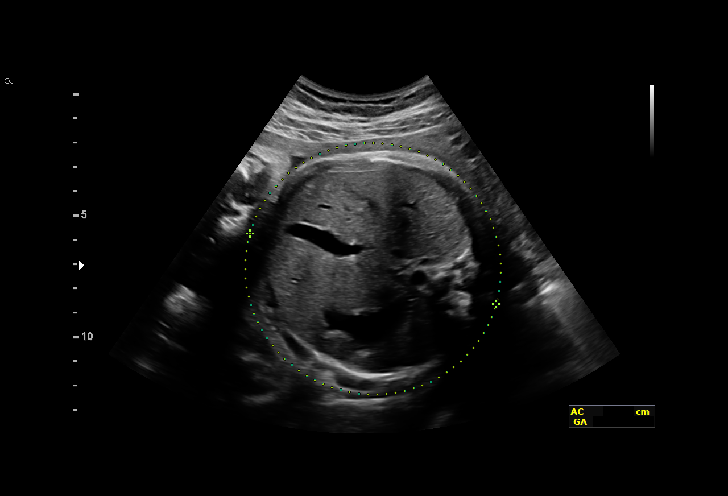
[im 22/40]
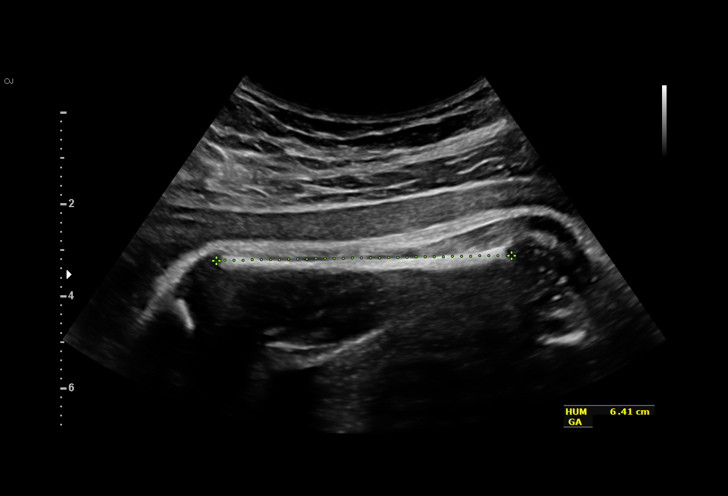
[im 25/40]
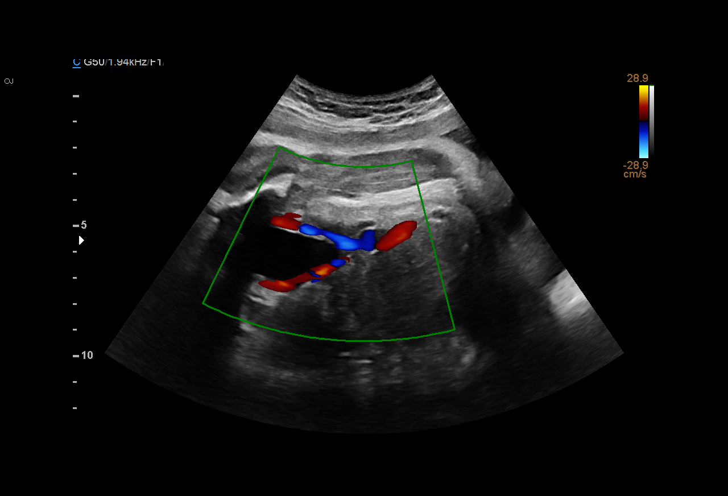
[im 28/40]
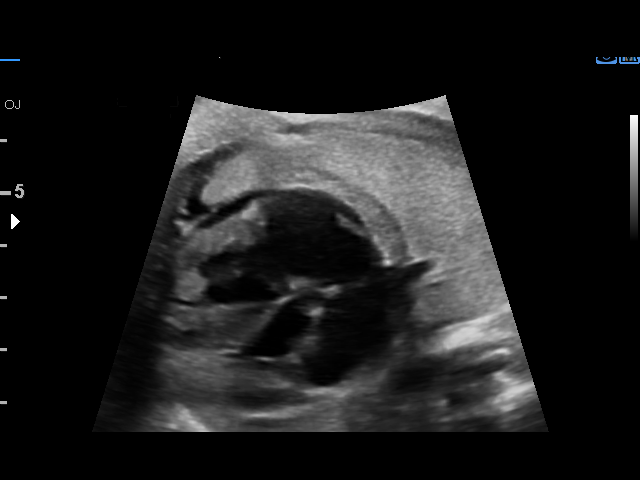
[im 31/40]
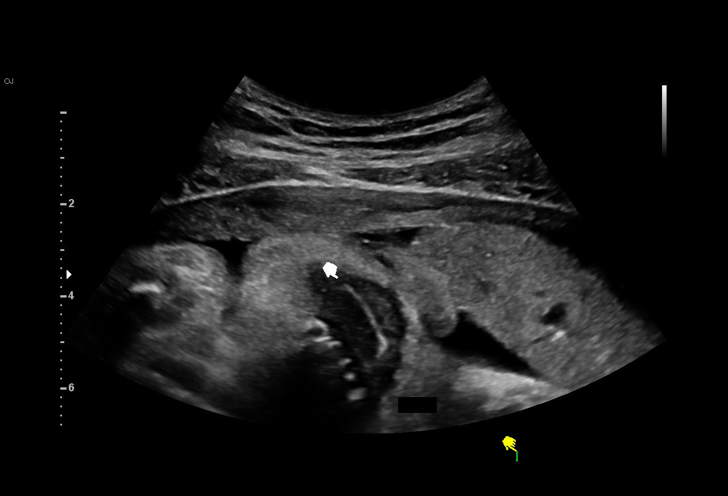
[im 34/40]
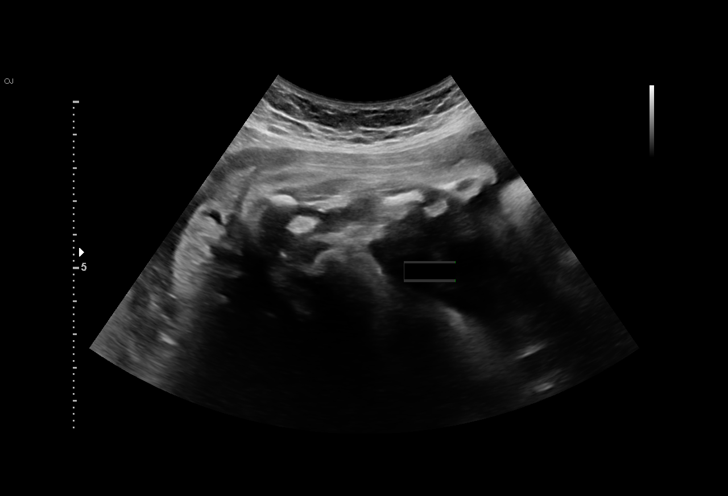
[im 37/40]
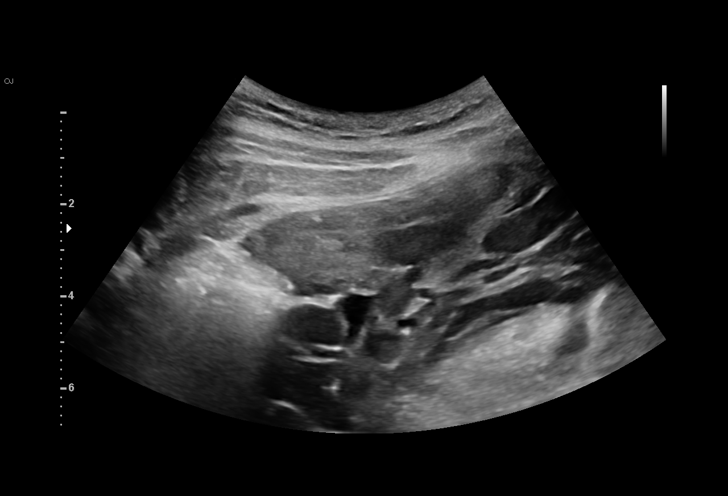
[im 40/40]
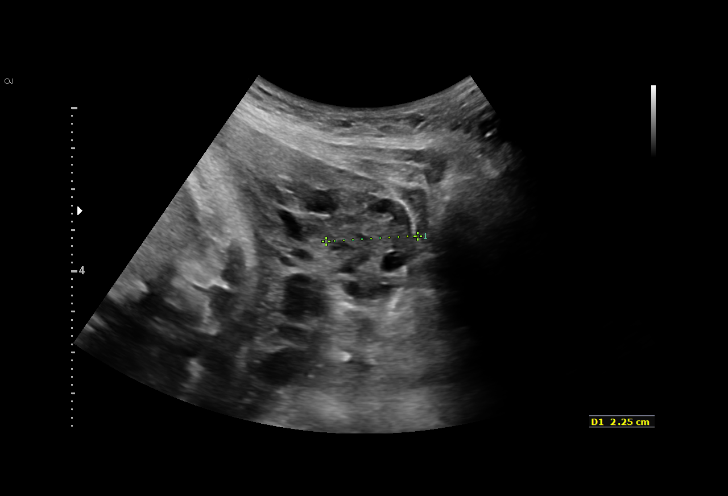

[14 of 28 positions shown; findings below may reference images not displayed]

1  VINICIUS SANGHA            [id number]      [id number]     [id number]
Indications

37 weeks gestation of pregnancy
Poor obstetric history: Previous preterm
delivery due to PROM (26 weeks and
delivered at 27 weeks)
Gestational diabetes in pregnancy, diet
controlled
OB History

Blood Type:            Height:         Weight (lb):  196      BMI:
Gravidity:    3         Term:   1        Prem:   1        SAB:   0
TOP:          0       Ectopic:  0        Living: 2
Fetal Evaluation

Num Of Fetuses:     1
Cardiac Activity:   Observed
Presentation:       Cephalic
Placenta:           Anterior, above cervical os
P. Cord Insertion:  Visualized

Amniotic Fluid
AFI FV:      Subjectively within normal limits

AFI Sum(cm)     %Tile       Largest Pocket(cm)
8.28            11

RUQ(cm)                     LUQ(cm)        LLQ(cm)
2.71
Biometry
BPD:      85.5  mm     G. Age:  34w 3d          7  %    CI:         78.1   %   70 - 86
FL/HC:      22.2   %   20.8 -
HC:      306.1  mm     G. Age:  34w 1d        < 3  %    HC/AC:      0.96       0.92 -
AC:      319.4  mm     G. Age:  35w 6d         32  %    FL/BPD:     79.6   %   71 - 87
FL:       68.1  mm     G. Age:  35w 0d          8  %    FL/AC:      21.3   %   20 - 24
HUM:      63.7  mm     G. Age:  37w 0d         69  %

Est. FW:    9435  gm    5 lb 13 oz      30  %
Gestational Age

LMP:           31w 4d       Date:   06/04/16                 EDD:   03/11/17
U/S Today:     34w 6d                                        EDD:   02/16/17
Best:          37w 0d    Det. By:   U/S  (10/11/16)          EDD:   02/01/17
Anatomy

Cranium:               Appears normal         Aortic Arch:            Previously seen
Cavum:                 Appears normal         Ductal Arch:            Previously seen
Ventricles:            Appears normal         Diaphragm:              Previously seen
Choroid Plexus:        Previously seen        Stomach:                Previously Seen
Cerebellum:            Previously seen        Abdomen:                Appears normal
Posterior Fossa:       Previously seen        Abdominal Wall:         Previously seen
Nuchal Fold:           Not applicable (>20    Cord Vessels:           Appears normal (3
wks GA)                                        vessel cord)
Face:                  Orbits and profile     Kidneys:                Appear normal
previously seen
Lips:                  Previously seen        Bladder:                Appears normal
Thoracic:              Appears normal         Spine:                  Previously seen
Heart:                 Appears normal         Upper Extremities:      Previously seen
(4CH, axis, and
situs)
RVOT:                  Previously seen        Lower Extremities:      Previously seen
LVOT:                  Appears normal

Other:  Male gender. Technically difficult due to fetal position.
Cervix Uterus Adnexa

Cervix
Not visualized (advanced GA >99wks)

Uterus
No abnormality visualized.

Left Ovary
Size(cm)       2.6 x     2     x  2.3       Vol(ml):
Within normal limits.

Right Ovary
Size(cm)       5.2 x    2.9    x  2         Vol(ml):
Within normal limits.

Cul De Sac:   No free fluid seen.
Impression

Single living intrauterine pregnancy at 91w3d.
Cephalic presentation.
Anterior placenta without evidence of previa
Appropriate interval fetal growth (30%).
Normal amniotic fluid volume.
Normal interval fetal anatomy.
Recommendations

Follow-up ultrasounds as clinically indicated.

## 2018-02-19 ENCOUNTER — Encounter (HOSPITAL_COMMUNITY): Payer: Self-pay

## 2018-02-19 ENCOUNTER — Ambulatory Visit (HOSPITAL_COMMUNITY)
Admission: RE | Admit: 2018-02-19 | Discharge: 2018-02-19 | Disposition: A | Discharge status: Home / Self Care | Payer: Medicaid Other | Source: Ambulatory Visit | Attending: Family Medicine | Admitting: Family Medicine

## 2018-02-19 ENCOUNTER — Encounter: Payer: Self-pay | Admitting: Family Medicine

## 2018-02-19 ENCOUNTER — Other Ambulatory Visit: Payer: Self-pay

## 2018-02-19 ENCOUNTER — Telehealth: Payer: Self-pay | Admitting: Family Medicine

## 2018-02-19 ENCOUNTER — Ambulatory Visit (INDEPENDENT_AMBULATORY_CARE_PROVIDER_SITE_OTHER): Payer: Medicaid Other | Admitting: Family Medicine

## 2018-02-19 VITALS — BP 104/60 | HR 58 | Temp 98.5°F | Ht 65.0 in | Wt 165.0 lb

## 2018-02-19 DIAGNOSIS — N83201 Unspecified ovarian cyst, right side: Secondary | ICD-10-CM | POA: Insufficient documentation

## 2018-02-19 DIAGNOSIS — R1031 Right lower quadrant pain: Secondary | ICD-10-CM | POA: Diagnosis present

## 2018-02-19 LAB — POCT URINE PREGNANCY: PREG TEST UR: NEGATIVE

## 2018-02-19 LAB — POCT URINALYSIS DIP (MANUAL ENTRY)
BILIRUBIN UA: NEGATIVE mg/dL
Bilirubin, UA: NEGATIVE
Blood, UA: NEGATIVE
GLUCOSE UA: NEGATIVE mg/dL
LEUKOCYTES UA: NEGATIVE
Nitrite, UA: NEGATIVE
Protein Ur, POC: NEGATIVE mg/dL
Spec Grav, UA: 1.01 (ref 1.010–1.025)
Urobilinogen, UA: 0.2 E.U./dL
pH, UA: 6.5 (ref 5.0–8.0)

## 2018-02-19 MED ORDER — IOHEXOL 300 MG/ML  SOLN
100.0000 mL | Freq: Once | INTRAMUSCULAR | Status: AC | PRN
  Administered 2018-02-19: 100 mL via INTRAVENOUS

## 2018-02-19 MED ORDER — IOPAMIDOL (ISOVUE-300) INJECTION 61%
INTRAVENOUS | Status: AC
  Filled 2018-02-19: qty 30

## 2018-02-19 NOTE — Telephone Encounter (Signed)
Attempted to call patient at mobile number x 2.  It was a [employer] voicemail.  Left message that I would try at home.    Called home, and daughter (a minor) states that her mom is not at home and can be reached at mobile number.  I will continue to try to reach patient.  If she calls here, please inform her that her CT abdomen showed a right ovarian cyst.  I can discuss with her treatment for this, but she does NOT need to go to the ER.

## 2018-02-19 NOTE — Patient Instructions (Addendum)
It was good to see you today.  Go for your CT scan today.  I will call you with the results.

## 2018-02-19 NOTE — Progress Notes (Signed)
Subjective:    Kim Robertson is a 37 y.o. female who presents to [facility] today for abdominal pain:  1.  Abdominal pain:  Started this AM.  Describes pain in right lower quadrant that is sharp and stabbing and has a "pulling" sensation seems to be pulling at her rectum.  She has a long-standing history of constipation recently has a bowel movement every 4 to 5 days or so.  However she did have a bowel movement yesterday described as normal.  She has had no diarrhea.  No injuries.  She has 3 children delivered vaginally.  No abdominal surgeries or prior C-sections.  She has never had similar pain in the past.  She has had no changes to her diet.  She is eating and drinking well.  She did have breakfast this morning but this is not unusual for her.  However she would not have wanted to eat if she did normally breakfast.  She has tried Milk of Magnesia and tea today to help with her abdominal pain but did not have any relief.  It is a fairly constant pain but better if she is lying still..  Denies any fevers or chills.  No nausea or vomiting currently.  ROS as above per HPI.  No chest pain or difficulty breathing.  She does not have any pleuritic type pain.  No vaginal discharge.  No vaginal bleeding.  She does not know when her last period was but believes it was within the month.  The following portions of the patient's history were reviewed and updated as appropriate: allergies, current medications, past medical history, family and social history, and problem list. Patient is a nonsmoker.    PMH reviewed.  Past Medical History:  Diagnosis Date  . Gestational diabetes   . Gestational diabetes mellitus (GDM), antepartum 11/21/2016  . Preterm labor    Past Surgical History:  Procedure Laterality Date  . NO PAST SURGERIES      Medications reviewed. Current Outpatient Medications  Medication Sig Dispense Refill  . polyethylene glycol (MIRALAX / GLYCOLAX) packet Take 17 g by mouth daily.     No  current facility-administered medications for this visit.      Objective:   Physical Exam BP 104/60   Pulse (!) 58   Temp 98.5 F (36.9 C) (Oral)   Ht 5' 5"  (1.651 m)   Wt 165 lb (74.8 kg)   SpO2 99%   BMI 27.46 kg/m  Gen:  Alert, cooperative patient who appears stated age in no acute distress.  Vital signs reviewed. HEENT: EOMI,  MMM Cardiac:  Regular rate and rhythm without murmur auscultated.  Good S1/S2. Pulm:  Clear to auscultation bilaterally with good air movement.  No wheezes or rales noted.   Abd:  Soft/nondistended.  TTP directly over the Right lower quadrant.  Mild guarding.  NO rebound.  Hypoactive bowel sounds.  Rosving's negative.    Imp/Plan: 1. RLQ abdominal pain: -I am concerned for appendicitis based on description of her pain and the fact that is in the right lower quadrant. -I gave her the option of going to the emergency room.  She would rather obtain outpatient imaging which will be done stat and then make a decision at that point based on the results of her imaging. -We are going to get a stat CBC and c-Met as well to help with decision-making.  This might also help if her CT scan is negative. -I will call her later today with results of CT  scan. -She does not appear toxic currently and her vital signs are good.

## 2018-02-20 ENCOUNTER — Encounter: Payer: Self-pay | Admitting: Family Medicine

## 2018-02-20 LAB — CBC WITH DIFFERENTIAL/PLATELET
BASOS ABS: 0 10*3/uL (ref 0.0–0.2)
Basos: 0 %
EOS (ABSOLUTE): 0.3 10*3/uL (ref 0.0–0.4)
EOS: 5 %
Hematocrit: 37.6 % (ref 34.0–46.6)
Hemoglobin: 12.7 g/dL (ref 11.1–15.9)
LYMPHS: 37 %
Lymphocytes Absolute: 2 10*3/uL (ref 0.7–3.1)
MCH: 26.6 pg (ref 26.6–33.0)
MCHC: 33.8 g/dL (ref 31.5–35.7)
MCV: 79 fL (ref 79–97)
MONOCYTES: 6 %
Monocytes Absolute: 0.4 10*3/uL (ref 0.1–0.9)
NEUTROS ABS: 2.9 10*3/uL (ref 1.4–7.0)
Neutrophils: 52 %
Platelets: 221 10*3/uL (ref 150–450)
RBC: 4.77 x10E6/uL (ref 3.77–5.28)
RDW: 13.9 % (ref 12.3–15.4)
WBC: 5.5 10*3/uL (ref 3.4–10.8)

## 2018-02-20 LAB — COMPREHENSIVE METABOLIC PANEL
A/G RATIO: 1.4 (ref 1.2–2.2)
ALT: 11 IU/L (ref 0–32)
AST: 12 IU/L (ref 0–40)
Albumin: 4.4 g/dL (ref 3.5–5.5)
Alkaline Phosphatase: 68 IU/L (ref 39–117)
BUN/Creatinine Ratio: 15 (ref 9–23)
BUN: 11 mg/dL (ref 6–20)
Bilirubin Total: 0.3 mg/dL (ref 0.0–1.2)
CALCIUM: 9.5 mg/dL (ref 8.7–10.2)
CO2: 25 mmol/L (ref 20–29)
Chloride: 102 mmol/L (ref 96–106)
Creatinine, Ser: 0.71 mg/dL (ref 0.57–1.00)
GFR, EST AFRICAN AMERICAN: 129 mL/min/{1.73_m2} (ref >59)
GFR, EST NON AFRICAN AMERICAN: 111 mL/min/{1.73_m2} (ref >59)
GLOBULIN, TOTAL: 3.2 g/dL (ref 1.5–4.5)
Glucose: 96 mg/dL (ref 65–99)
POTASSIUM: 4.3 mmol/L (ref 3.5–5.2)
SODIUM: 134 mmol/L (ref 134–144)
Total Protein: 7.6 g/dL (ref 6.0–8.5)

## 2018-02-20 MED ORDER — IBUPROFEN 800 MG PO TABS: 800.0000 mg | ORAL_TABLET | Freq: Three times a day (TID) | ORAL | 0 refills | Status: DC | PRN

## 2018-02-20 NOTE — Telephone Encounter (Signed)
Patient called back.  I was able to talk with her.  She had not gotten my message from yesterday because she had left her phone off.  Pain is a little better today.  Still hasn't taken anything for it.  Discussed that CT was normal except for Right ovarian cyst, likely the cause of her pain.  Plan to treat with ibuprofen.  Answered all questions.  She was appreciative of call.  Will send in ibuprofen 800 mg PRN for pain relief.

## 2018-07-10 ENCOUNTER — Encounter (HOSPITAL_COMMUNITY): Payer: Self-pay | Admitting: Emergency Medicine

## 2018-07-10 ENCOUNTER — Ambulatory Visit (HOSPITAL_COMMUNITY)
Admission: EM | Admit: 2018-07-10 | Discharge: 2018-07-10 | Disposition: A | Discharge status: Home / Self Care | Payer: Medicaid Other | Attending: Family Medicine | Admitting: Family Medicine

## 2018-07-10 ENCOUNTER — Other Ambulatory Visit: Payer: Self-pay

## 2018-07-10 DIAGNOSIS — L509 Urticaria, unspecified: Secondary | ICD-10-CM | POA: Diagnosis not present

## 2018-07-10 MED ORDER — PREDNISONE 10 MG (21) PO TBPK: ORAL_TABLET | Freq: Every day | ORAL | 0 refills | Status: DC

## 2018-07-10 NOTE — ED Provider Notes (Signed)
[facility]   [id number] 07/10/18 Arrival Time: 9528  ASSESSMENT & PLAN:  1. Urticaria    Meds ordered this encounter  Medications  . predniSONE (STERAPRED UNI-PAK 21 TAB) 10 MG (21) TBPK tablet    Sig: Take by mouth daily. Take as directed.    Dispense:  21 tablet    Refill:  0   Benadryl if needed.  Will follow up with PCP or here if worsening or failing to improve as anticipated. Reviewed expectations re: course of current medical issues. Questions answered. Outlined signs and symptoms indicating need for more acute intervention. Patient verbalized understanding. After Visit Summary given.   SUBJECTIVE:  Kim Robertson is a 37 y.o. female who presents with a skin complaint.   Location: diffuse Onset: abrupt Duration: a few weeks Associated pruritis? significant Associated pain? none Progression: fluctuating a lot  Drainage? No  Known trigger? No  New soaps/lotions/topicals/detergents/environmental exposures? No Contacts with similar? No Recent travel? No  Other associated symptoms: none Therapies tried thus far: none Arthralgia or myalgia? none Recent illness? none Fever? none Hot shower exacerbates. No specific alleviating factors reported. No h/o similar.  ROS: As per HPI.  OBJECTIVE: Vitals:   07/10/18 0920  BP: 110/65  Pulse: 66  Resp: 18  Temp: 98.7 F (37.1 C)  TempSrc: Oral  SpO2: 100%    General appearance: alert; no distress Lungs: clear to auscultation bilaterally Heart: regular rate and rhythm Extremities: no edema Skin: warm and dry; smooth, slightly elevated and erythematous plaques of variable size over her torso and upper extremities mainly; + dermatographism; no signs of skin infection; no open wounds Psychological: alert and cooperative; normal mood and affect  Allergies  Allergen Reactions  . Neosporin [Neomycin-Bacitracin Zn-Polymyx] Rash    Past Medical History:  Diagnosis Date  . Gestational diabetes   .  Gestational diabetes mellitus (GDM), antepartum 11/21/2016  . Preterm labor    Social History   Socioeconomic History  . Marital status: Married    Spouse name: Not on file  . Number of children: Not on file  . Years of education: Not on file  . Highest education level: Not on file  Occupational History  . Not on file  Social Needs  . Financial resource strain: Not on file  . Food insecurity:    Worry: Not on file    Inability: Not on file  . Transportation needs:    Medical: Not on file    Non-medical: Not on file  Tobacco Use  . Smoking status: Never Smoker  . Smokeless tobacco: Never Used  Substance and Sexual Activity  . Alcohol use: No  . Drug use: No  . Sexual activity: Not Currently    Birth control/protection: IUD  Lifestyle  . Physical activity:    Days per week: Not on file    Minutes per session: Not on file  . Stress: Not on file  Relationships  . Social connections:    Talks on phone: Not on file    Gets together: Not on file    Attends religious service: Not on file    Active member of club or organization: Not on file    Attends meetings of clubs or organizations: Not on file    Relationship status: Not on file  . Intimate partner violence:    Fear of current or ex partner: Not on file    Emotionally abused: Not on file    Physically abused: Not on file    Forced  sexual activity: Not on file  Other Topics Concern  . Not on file  Social History Narrative  . Not on file   No family history on file. Past Surgical History:  Procedure Laterality Date  . NO PAST SURGERIES       Mardella Layman, MD 07/10/18 1037

## 2018-07-10 NOTE — ED Triage Notes (Signed)
Generalized rash for 3 weeks.  Rash itches.

## 2019-08-05 ENCOUNTER — Emergency Department (HOSPITAL_COMMUNITY)
Admission: EM | Admit: 2019-08-05 | Discharge: 2019-08-05 | Disposition: A | Discharge status: Home / Self Care | Payer: Medicaid Other | Attending: Emergency Medicine | Admitting: Emergency Medicine

## 2019-08-05 DIAGNOSIS — L292 Pruritus vulvae: Secondary | ICD-10-CM | POA: Insufficient documentation

## 2019-08-05 DIAGNOSIS — N898 Other specified noninflammatory disorders of vagina: Secondary | ICD-10-CM

## 2019-08-05 DIAGNOSIS — N899 Noninflammatory disorder of vagina, unspecified: Secondary | ICD-10-CM | POA: Insufficient documentation

## 2019-08-05 LAB — URINALYSIS, ROUTINE W REFLEX MICROSCOPIC
Bilirubin Urine: NEGATIVE
Glucose, UA: NEGATIVE mg/dL
Ketones, ur: NEGATIVE mg/dL
Nitrite: NEGATIVE
Protein, ur: NEGATIVE mg/dL
Specific Gravity, Urine: 1.019 (ref 1.005–1.030)
pH: 6 (ref 5.0–8.0)

## 2019-08-05 LAB — WET PREP, GENITAL
Sperm: NONE SEEN
Trich, Wet Prep: NONE SEEN
Yeast Wet Prep HPF POC: NONE SEEN

## 2019-08-05 LAB — PREGNANCY, URINE: Preg Test, Ur: NEGATIVE

## 2019-08-05 MED ORDER — METRONIDAZOLE 1 % EX GEL: Freq: Every day | CUTANEOUS | 0 refills | Status: DC

## 2019-08-05 MED ORDER — HYDROXYZINE HCL 25 MG PO TABS: 25.0000 mg | ORAL_TABLET | Freq: Four times a day (QID) | ORAL | 0 refills | Status: DC

## 2019-08-05 MED ORDER — NYSTATIN 100000 UNIT/GM EX POWD: Freq: Four times a day (QID) | CUTANEOUS | 0 refills | Status: DC

## 2019-08-05 NOTE — ED Provider Notes (Signed)
[facility] EMERGENCY DEPARTMENT Provider Note   CSN: [id number] Arrival date & time: 08/05/19  1757     History   Chief Complaint Chief Complaint  Patient presents with  . Vaginal Itching    HPI Kim Robertson is a 38 y.o. female.     Patient is a 38 year old female with past medical history of gestational diabetes presenting to the emergency department for vaginal itching which began over the last 4 days.  Patient reports that she was having some stomach upset and thought that she was having intestinal worms so her sister gave her some of her prescription for Flagyl about 4 days ago.  She started taking this and then began to feel itching in her vaginal and anal area and thinks she might have a yeast infection.  She reports a clear and brown-colored vaginal discharge but no abdominal pain, nausea, vomiting, fever, chills, dysuria, hematuria, flank pain.  She reports that she does not think she has any STDs since she is in a monogamous relationship and also has an IUD so does not think she is pregnant.     Past Medical History:  Diagnosis Date  . Gestational diabetes   . Gestational diabetes mellitus (GDM), antepartum 11/21/2016  . Preterm labor     Patient Active Problem List   Diagnosis Date Noted  . Heavy menstrual bleeding 11/06/2017    Past Surgical History:  Procedure Laterality Date  . NO PAST SURGERIES       OB History    Gravida  3   Para  3   Term  2   Preterm  1   AB      Living  3     SAB      TAB      Ectopic      Multiple  0   Live Births  3            Home Medications    Prior to Admission medications   Medication Sig Start Date End Date Taking? Authorizing Provider  fexofenadine (ALLEGRA) 60 MG tablet Take 60 mg by mouth 2 (two) times daily.    [provider]  hydrOXYzine (ATARAX/VISTARIL) 25 MG tablet Take 1 tablet (25 mg total) by mouth every 6 (six) hours. 08/05/19   Arlyn Dunning, PA-C   ibuprofen (ADVIL,MOTRIN) 800 MG tablet Take 1 tablet (800 mg total) by mouth every 8 (eight) hours as needed. 02/20/18   Tobey Grim, MD  metroNIDAZOLE (METROGEL) 1 % gel Apply topically daily. 08/05/19   Ronnie Doss A, PA-C  nystatin (MYCOSTATIN/NYSTOP) powder Apply topically 4 (four) times daily. 08/05/19   Ronnie Doss A, PA-C  polyethylene glycol (MIRALAX / GLYCOLAX) packet Take 17 g by mouth daily.    [provider]  predniSONE (STERAPRED UNI-PAK 21 TAB) 10 MG (21) TBPK tablet Take by mouth daily. Take as directed. 07/10/18   Mardella Layman, MD    Family History No family history on file.  Social History Social History   Tobacco Use  . Smoking status: Never Smoker  . Smokeless tobacco: Never Used  Substance Use Topics  . Alcohol use: No  . Drug use: No     Allergies   Neosporin [neomycin-bacitracin zn-polymyx]   Review of Systems Review of Systems  Constitutional: Negative for appetite change, chills and fever.  HENT: Negative for sore throat.   Respiratory: Negative for cough and shortness of breath.   Cardiovascular: Negative for chest pain.  Gastrointestinal:  Negative for abdominal pain, anal bleeding, blood in stool, constipation, nausea, rectal pain and vomiting.  Genitourinary: Positive for vaginal discharge. Negative for decreased urine volume, difficulty urinating, dyspareunia, dysuria, enuresis, flank pain, frequency, genital sores, hematuria, menstrual problem, pelvic pain, urgency, vaginal bleeding and vaginal pain.  Musculoskeletal: Negative for back pain.  Skin: Negative for rash.  Neurological: Negative for dizziness and light-headedness.     Physical Exam Updated Vital Signs BP 125/73   Pulse 81   Temp 98.4 F (36.9 C) (Oral)   Resp 16   SpO2 100%   Physical Exam Vitals signs and nursing note reviewed. Exam conducted with a chaperone present.  Constitutional:      Appearance: Normal appearance.  HENT:     Head: Normocephalic.   Eyes:     Conjunctiva/sclera: Conjunctivae normal.  Pulmonary:     Effort: Pulmonary effort is normal.  Genitourinary:      Comments: irritation of the skin to the bilateral buttock.  No warts or blisters but there appear to be maybe 1 or 2 ulceration on the buttock.  There are no vaginal lesions.  There is mild vaginal mucus.  No cervical motion tenderness.  Normal cervix and vaginal walls.  Skin:    General: Skin is dry.  Neurological:     Mental Status: She is alert.  Psychiatric:        Mood and Affect: Mood normal.      ED Treatments / Results  Labs (all labs ordered are listed, but only abnormal results are displayed) Labs Reviewed  WET PREP, GENITAL - Abnormal; Notable for the following components:      Result Value   Clue Cells Wet Prep HPF POC PRESENT (*)    WBC, Wet Prep HPF POC MODERATE (*)    All other components within normal limits  URINALYSIS, ROUTINE W REFLEX MICROSCOPIC - Abnormal; Notable for the following components:   APPearance HAZY (*)    Hgb urine dipstick LARGE (*)    Leukocytes,Ua MODERATE (*)    Bacteria, UA FEW (*)    All other components within normal limits  HSV CULTURE AND TYPING  URINE CULTURE  PREGNANCY, URINE  GC/CHLAMYDIA PROBE AMP ([place]) NOT AT [facility]    EKG None  Radiology No results found.  Procedures Procedures (including critical care time)  Medications Ordered in ED Medications - No data to display   Initial Impression / Assessment and Plan / ED Course  I have reviewed the triage vital signs and the nursing notes.  Pertinent labs & imaging results that were available during my care of the patient were reviewed by me and considered in my medical decision making (see chart for details).  Clinical Course as of Aug 04 2005  Tue Aug 05, 2019  1954 Patient presenting with concern for yeast infection, vaginal and anal itching for the past 3 or 4 days.  Had previously been taking metronidazole inconsistently that she  got from her sister due to feeling like she had "stomach worms".  Currently not having any abdominal pain, dysuria, hematuria.  Exam is consistent with irritation in the skin.  Unclear etiology of such.  I did take vaginal swabs for gonorrhea, chlamydia, wet prep as well as HSV cultures.  Patient is not concerned with any STDs so we will hold off on treatment of STDs at this time.  She does have leukocytes and bacteria in her urine but no dysuria.  Positive clue cells.  I will treat her clue cells  with MetroGel.  We will also give her nystatin powder as she had damp skin in between her buttock which may be causing the skin irritation.  Patient was advised that we do not have the results of gonorrhea, chlamydia or HSV at this time and that she should practice safe sex until we know the results of such.   [KM]    Clinical Course User Index [KM] Alveria Apley, PA-C       Based on review of vitals, medical screening exam, lab work and/or imaging, there does not appear to be an acute, emergent etiology for the patient's symptoms. Counseled pt on good return precautions and encouraged both PCP and ED follow-up as needed.  Prior to discharge, I also discussed incidental imaging findings with patient in detail and advised appropriate, recommended follow-up in detail.  Clinical Impression: 1. Vaginal irritation     Disposition: Discharge  Prior to providing a prescription for a controlled substance, I independently reviewed the patient's recent prescription history on the [place]. The patient had no recent or regular prescriptions and was deemed appropriate for a brief, less than 3 day prescription of narcotic for acute analgesia.  This note was prepared with assistance of Systems analyst. Occasional wrong-word or sound-a-like substitutions may have occurred due to the inherent limitations of voice recognition software.   Final Clinical  Impressions(s) / ED Diagnoses   Final diagnoses:  Vaginal irritation    ED Discharge Orders         Ordered    hydrOXYzine (ATARAX/VISTARIL) 25 MG tablet  Every 6 hours     08/05/19 2005    metroNIDAZOLE (METROGEL) 1 % gel  Daily     08/05/19 2005    nystatin (MYCOSTATIN/NYSTOP) powder  4 times daily     08/05/19 2005           Kristine Royal 08/05/19 2006    Little, Wenda Overland, MD 08/05/19 2124

## 2019-08-05 NOTE — ED Notes (Signed)
Patient verbalizes understanding of discharge instructions. Opportunity for questioning and answers were provided. Armband removed by staff, pt discharged from ED ambulatory.   

## 2019-08-05 NOTE — Discharge Instructions (Signed)
You are seen today for vaginal irritation.  Your work-up showed that you have clue cells which may indicate that you have an abnormal balance of pH and bacteria in your vagina.  This is treated with MetroGel which I have prescribed for you.  You also had some irritation in the surrounding skin around the buttocks.  I have prescribed you a powder for this.  I have also given you hydroxyzine which you can take as needed for itching.  We have not received all of your results today.  Your results for herpes, gonorrhea, chlamydia are all pending.  Please follow-up with these results.  If any of these are positive you will need additional medications for treatment.  Please follow-up with your primary care doctor or OB/GYN.

## 2019-08-05 NOTE — ED Triage Notes (Signed)
Pt here for evaluation of vaginal itching x 3 days. Pt denies discharge or bleeding. Is not concerned for STDs. Just started taking Flagyl (not prescribed by a doctor, pt got these from a friend who had some leftover and told her to take them for worms) and she said she read that vaginal itching is a side effect.

## 2019-08-07 LAB — GC/CHLAMYDIA PROBE AMP (~~LOC~~) NOT AT ARMC
Chlamydia: NEGATIVE
Neisseria Gonorrhea: NEGATIVE

## 2019-08-07 LAB — URINE CULTURE: Culture: NO GROWTH

## 2019-08-09 LAB — HSV CULTURE AND TYPING

## 2020-01-02 ENCOUNTER — Ambulatory Visit (HOSPITAL_COMMUNITY)
Admission: EM | Admit: 2020-01-02 | Discharge: 2020-01-02 | Disposition: A | Discharge status: Home / Self Care | Payer: Self-pay | Attending: Internal Medicine | Admitting: Internal Medicine

## 2020-01-02 ENCOUNTER — Other Ambulatory Visit: Payer: Self-pay

## 2020-01-02 NOTE — ED Notes (Signed)
Pt was called by me and the front desk staff. Pt did not answer.

## 2020-01-02 NOTE — ED Notes (Signed)
Tried calling once

## 2020-01-03 ENCOUNTER — Ambulatory Visit (HOSPITAL_COMMUNITY)
Admission: EM | Admit: 2020-01-03 | Discharge: 2020-01-03 | Disposition: A | Discharge status: Home / Self Care | Payer: Self-pay | Attending: Family Medicine | Admitting: Family Medicine

## 2020-01-03 ENCOUNTER — Other Ambulatory Visit: Payer: Self-pay

## 2020-01-03 ENCOUNTER — Encounter (HOSPITAL_COMMUNITY): Payer: Self-pay

## 2020-01-03 DIAGNOSIS — R21 Rash and other nonspecific skin eruption: Secondary | ICD-10-CM

## 2020-01-03 MED ORDER — LEVOCETIRIZINE DIHYDROCHLORIDE 5 MG PO TABS: 5.0000 mg | ORAL_TABLET | Freq: Every evening | ORAL | 1 refills | Status: AC

## 2020-01-03 MED ORDER — METHYLPREDNISOLONE SODIUM SUCC 125 MG IJ SOLR
INTRAMUSCULAR | Status: AC
  Filled 2020-01-03: qty 2

## 2020-01-03 MED ORDER — TRIAMCINOLONE ACETONIDE 0.1 % EX CREA: 1.0000 "application " | TOPICAL_CREAM | Freq: Two times a day (BID) | CUTANEOUS | 1 refills | Status: DC

## 2020-01-03 MED ORDER — PREDNISONE 20 MG PO TABS: ORAL_TABLET | ORAL | 0 refills | Status: AC

## 2020-01-03 MED ORDER — METHYLPREDNISOLONE SODIUM SUCC 125 MG IJ SOLR
125.0000 mg | Freq: Once | INTRAMUSCULAR | Status: AC
  Administered 2020-01-03: 17:00:00 125 mg via INTRAMUSCULAR

## 2020-01-03 NOTE — Discharge Instructions (Addendum)
You were given an injection today of prednisone therefore start oral prednisone tomorrow. Follow-up with PCP regarding a dermatology referral for further work-up of what is causing the recurrence of the rash.

## 2020-01-03 NOTE — ED Triage Notes (Signed)
Pt present a rash that has spreading  on her face, arms, necks and chest.  The rash is itching, burning and painful to the touch. Symptoms started 3 days ago.

## 2020-01-03 NOTE — ED Provider Notes (Signed)
[facility]    CSN: [id number] Arrival date & time: 01/03/20  1515      History   Chief Complaint Chief Complaint  Patient presents with  . Rash    neck, face     HPI Kim Robertson is a 39 y.o. female.   HPI  Patient presents with a diffuse rash x 3 days. Rash is burning, excoriated, and gradually worsening. Rash is present on her neck, chest, and covering the entire torso. Neck rash is the worst and has developed in a leather like skin texture on back neck impairing full ROM. Rash is excoriated as she has achieved no relief of itching with conservative treatment. This same course of skin eruption occurred proximately 2 months ago and prednisone temporarily resolved rash. Patient denies fever, new food or cosmetic exposure, new medication, or changes in detergent. Past Medical History:  Diagnosis Date  . Gestational diabetes   . Gestational diabetes mellitus (GDM), antepartum 11/21/2016  . Preterm labor     Patient Active Problem List   Diagnosis Date Noted  . Heavy menstrual bleeding 11/06/2017    Past Surgical History:  Procedure Laterality Date  . NO PAST SURGERIES      OB History    Gravida  3   Para  3   Term  2   Preterm  1   AB      Living  3     SAB      TAB      Ectopic      Multiple  0   Live Births  3            Home Medications    Prior to Admission medications   Medication Sig Start Date End Date Taking? Authorizing Provider  fexofenadine (ALLEGRA) 60 MG tablet Take 60 mg by mouth 2 (two) times daily.    [provider]  hydrOXYzine (ATARAX/VISTARIL) 25 MG tablet Take 1 tablet (25 mg total) by mouth every 6 (six) hours. 08/05/19   Arlyn Dunning, PA-C  ibuprofen (ADVIL,MOTRIN) 800 MG tablet Take 1 tablet (800 mg total) by mouth every 8 (eight) hours as needed. 02/20/18   Tobey Grim, MD  metroNIDAZOLE (METROGEL) 1 % gel Apply topically daily. 08/05/19   Ronnie Doss A, PA-C  nystatin (MYCOSTATIN/NYSTOP)  powder Apply topically 4 (four) times daily. 08/05/19   Ronnie Doss A, PA-C  polyethylene glycol (MIRALAX / GLYCOLAX) packet Take 17 g by mouth daily.    [provider]  predniSONE (STERAPRED UNI-PAK 21 TAB) 10 MG (21) TBPK tablet Take by mouth daily. Take as directed. 07/10/18   Mardella Layman, MD    Family History History reviewed. No pertinent family history.  Social History Social History   Tobacco Use  . Smoking status: Never Smoker  . Smokeless tobacco: Never Used  Substance Use Topics  . Alcohol use: No  . Drug use: No     Allergies   Neosporin [neomycin-bacitracin zn-polymyx]   Review of Systems Review of Systems Pertinent negatives listed in HPI Physical Exam Triage Vital Signs ED Triage Vitals  Enc Vitals Group     BP 01/03/20 1602 115/73     Pulse Rate 01/03/20 1602 75     Resp 01/03/20 1602 18     Temp 01/03/20 1602 98.6 F (37 C)     Temp Source 01/03/20 1602 Oral     SpO2 01/03/20 1602 100 %     Weight --  Height --      Head Circumference --      Peak Flow --      Pain Score 01/03/20 1601 10     Pain Loc --      Pain Edu? --      Excl. in [place]? --    No data found.  Updated Vital Signs BP 115/73 (BP Location: Right Arm)   Pulse 75   Temp 98.6 F (37 C) (Oral)   Resp 18   SpO2 100%   Visual Acuity Right Eye Distance:   Left Eye Distance:   Bilateral Distance:    Right Eye Near:   Left Eye Near:    Bilateral Near:     Physical Exam Constitutional:      General: She is in acute distress.     Appearance: Normal appearance. She is not toxic-appearing.  Cardiovascular:     Rate and Rhythm: Normal rate and regular rhythm.  Musculoskeletal:     Cervical back: Rigidity present.  Lymphadenopathy:     Cervical: Cervical adenopathy present.  Skin:    General: Skin is warm and dry.     Findings: Erythema and rash present. Rash is macular, papular and scaling. Rash is not crusting, nodular, purpuric, pustular, urticarial or  vesicular.  Neurological:     General: No focal deficit present.     Mental Status: She is alert and oriented to person, place, and time.  Psychiatric:        Mood and Affect: Mood normal.      UC Treatments / Results  Labs (all labs ordered are listed, but only abnormal results are displayed) Labs Reviewed - No data to display  EKG   Radiology No results found.  Procedures Procedures (including critical care time)  Medications Ordered in UC Medications  methylPREDNISolone sodium succinate (SOLU-MEDROL) 125 mg/2 mL injection 125 mg (125 mg Intramuscular Given 01/03/20 1656)    Initial Impression / Assessment and Plan / UC Course  I have reviewed the triage vital signs and the nursing notes.  Pertinent labs & imaging results that were available during my care of the patient were reviewed by me and considered in my medical decision making (see chart for details).     Rash and nonspecific skin eruption Unknown etiology, appearance is suspicious for atopic dermatitis. Rash is significant and diffusely spread to upper body. Patient encouraged to obtain follow-up with dermatology for further evaluation of etiology of recurrent rash. For now will treat for symptoms and appearance consistent with  atopic dermatology rash. -Solumedrol 125 mg IM administered in clinic and start oral prednisone taper x 9 days -Levocetirizine 5 mg once daily itching and irritation -Triamcinolone 0.1% twice daily until rash resolved.  Final Clinical Impressions(s) / UC Diagnoses   Final diagnoses:  Rash and nonspecific skin eruption     Discharge Instructions     You were given an injection today of prednisone therefore start oral prednisone tomorrow. Follow-up with PCP regarding a dermatology referral for further work-up of what is causing the recurrence of the rash.     ED Prescriptions    Medication Sig Dispense Auth. Provider   predniSONE (DELTASONE) 20 MG tablet Take 3 PO QAM x3days,  2 PO QAM x3days, 1 PO QAM x3days 18 tablet Scot Jun, FNP   levocetirizine (XYZAL) 5 MG tablet Take 1 tablet (5 mg total) by mouth every evening. 90 tablet Scot Jun, FNP   triamcinolone cream (KENALOG) 0.1 % Apply 1 application  topically 2 (two) times daily. 454 g Bing Neighbors, FNP     PDMP not reviewed this encounter.   Bing Neighbors, FNP 01/04/20 2357

## 2020-01-09 ENCOUNTER — Other Ambulatory Visit: Payer: Self-pay

## 2020-01-09 ENCOUNTER — Encounter: Payer: Self-pay | Admitting: Family Medicine

## 2020-01-09 ENCOUNTER — Ambulatory Visit (INDEPENDENT_AMBULATORY_CARE_PROVIDER_SITE_OTHER): Payer: Self-pay | Admitting: Family Medicine

## 2020-01-09 VITALS — BP 90/60 | HR 74 | Ht 65.0 in | Wt 173.5 lb

## 2020-01-09 DIAGNOSIS — R110 Nausea: Secondary | ICD-10-CM

## 2020-01-09 DIAGNOSIS — R109 Unspecified abdominal pain: Secondary | ICD-10-CM

## 2020-01-09 DIAGNOSIS — R21 Rash and other nonspecific skin eruption: Secondary | ICD-10-CM

## 2020-01-09 MED ORDER — FAMOTIDINE 20 MG PO TABS: 20.0000 mg | ORAL_TABLET | Freq: Every day | ORAL | 1 refills | Status: AC

## 2020-01-09 NOTE — Progress Notes (Signed)
    SUBJECTIVE:   CHIEF COMPLAINT: Discuss Derm referral and possible bowel infection  HPI:   RASH - Previously seen by urgent care on 5/1 for diffuse rash x3 days.  Described as burning, excoriated, gradually worsening.  Located on neck, chest, entire torso.  No relief with conservative treatment.  Considered atopic dermatitis at that time.  Given Solu-Medrol.  Recommended Xyzal, triamcinolone, and prednisone taper x 9 days.  Recommended dermatology follow-up. - Returns with improvement. Continues on steroid taper.  - has h/o similar rash, last about 1 month ago that resolved with steroids. Only chest, back, face. Remembers having intermittent episodes as far back as 12-15yo in [place]. - unknown allergies but did have facial swelling reaction to neosporin and similar rash with eating seafood. - denies trouble swallowing or breathing, fevers, mouth sores, facial or tongue swelling, joint swelling or pain - denies preceding new meds or abx, new pets or animal exposure, recent travel, new soaps or detergents  Abdominal discomfort - Endorsing intermittent nausea and difficulties eating that has been going on for years with associated anal itching. 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 Denies blood in stool. Thinks she has lost some weight over the past few years.   PERTINENT  PMH / PSH: Heavy menstrual bleeding  OBJECTIVE:   BP 90/60   Pulse 74   Ht 5\' 5"  (1.651 m)   Wt 173 lb 8 oz (78.7 kg)   SpO2 99%   BMI 28.87 kg/m   Gen: well appearing, in NAD HEENT: MMM, no oral mucosal lesions Skin: healing rash noted to face, chest, back, neck, now nodular in appearance (picture of rash prior to treatment shown by patient appears dry, papular). No discharge or bleeding noted.   ASSESSMENT/PLAN:   Rash and nonspecific skin eruption Unclear etiology but appears remitting, likely allergic. Well healing with oral and topical  steroids. With no clear inciting trigger, discussed possibility of allergy referral for testing, however may reveal allergens that aren't true trigger. Patient elects to continue on daily antihistamine and will alert if subsequent episode arises despite antihistamine, can consider allergy referral at that time if no trigger elicited.  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 Will obtain O&P testing today. If negative, recommend RTC for full abdominal exam and workup including CBC, CMP, H pylori breath test.    , DO [facility]

## 2020-01-09 NOTE — Patient Instructions (Signed)
It was great to see you!  Our plans for today:  - We will let you know the results of your stool test. - Keep taking the prednisone until it is gone. Continue to take the levocetirizine and steroid cream for itching.  - Let us know if you have another episode and we can consider allergy testing.  Take care and seek immediate care sooner if you develop any concerns.   Dr. [facility]

## 2020-01-12 DIAGNOSIS — R109 Unspecified abdominal pain: Secondary | ICD-10-CM | POA: Insufficient documentation

## 2020-01-12 DIAGNOSIS — R21 Rash and other nonspecific skin eruption: Secondary | ICD-10-CM | POA: Insufficient documentation

## 2020-01-12 NOTE — Assessment & Plan Note (Signed)
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 Will obtain O&P testing today. If negative, recommend RTC for full abdominal exam and workup including CBC, CMP, H pylori breath test.

## 2020-01-12 NOTE — Assessment & Plan Note (Signed)
Unclear etiology but appears remitting, likely allergic. Well healing with oral and topical steroids. With no clear inciting trigger, discussed possibility of allergy referral for testing, however may reveal allergens that aren't true trigger. Patient elects to continue on daily antihistamine and will alert if subsequent episode arises despite antihistamine, can consider allergy referral at that time if no trigger elicited.

## 2020-01-13 ENCOUNTER — Other Ambulatory Visit: Payer: Self-pay | Admitting: Family Medicine

## 2020-01-16 LAB — OVA AND PARASITE EXAMINATION

## 2020-01-21 ENCOUNTER — Ambulatory Visit (INDEPENDENT_AMBULATORY_CARE_PROVIDER_SITE_OTHER): Payer: Self-pay | Admitting: Family Medicine

## 2020-01-21 ENCOUNTER — Other Ambulatory Visit: Payer: Self-pay

## 2020-01-21 VITALS — BP 117/80 | HR 84 | Ht 63.0 in | Wt 177.6 lb

## 2020-01-21 DIAGNOSIS — R21 Rash and other nonspecific skin eruption: Secondary | ICD-10-CM

## 2020-01-21 MED ORDER — TRIAMCINOLONE ACETONIDE 0.5 % EX OINT: 1.0000 "application " | TOPICAL_OINTMENT | Freq: Two times a day (BID) | CUTANEOUS | 3 refills | Status: AC

## 2020-01-21 NOTE — Patient Instructions (Signed)
We are trying a stronger steroid cream.  The fungal stain was negative.  We are referring you to dermatology.

## 2020-01-21 NOTE — Progress Notes (Signed)
    SUBJECTIVE:   CHIEF COMPLAINT / HPI:   RASH  Had rash for 3 weeks. Location: trunk, body, extremities Medications tried: oral predinsone, Xyzal, triamcinolone 0.1%, improved with prednisone, itching improved with Xyzal, but still spreading Similar rash in past: possibly when she was young  New medications or antibiotics: N  Tick, Insect or new pet exposure: n Recent travel: n New detergent or soap: n Immunocompromised: n  Symptoms Itching: Y Pain over rash: n Feeling ill all over: n Fever: n Mouth sores: n Face or tongue swelling: n Trouble breathing: n Joint swelling or pain: n  Review of Symptoms - see HPI   OBJECTIVE:   BP 117/80   Pulse 84   Ht 5\' 3"  (1.6 m)   Wt 177 lb 9.6 oz (80.6 kg)   SpO2 98%   BMI 31.46 kg/m   General: NAD,  Skin: numerous small, erythematous papules on extremities, trunk, face is spared HEENT: no oromucosal lesions  ASSESSMENT/PLAN:   Rash and nonspecific skin eruption Unlikely allergic given resolution of abdominal symptoms. Does not appear in classic wheel and flare appearance. Improvement with oral steroid. KOH neg. Trial stronger potency topical.  - F/u in two weeks - if no improvement, referral to Derm.      , MD [facility]

## 2020-01-22 NOTE — Assessment & Plan Note (Signed)
Unlikely allergic given resolution of abdominal symptoms. Does not appear in classic wheel and flare appearance. Improvement with oral steroid. KOH neg. Trial stronger potency topical.  - F/u in two weeks - if no improvement, referral to Derm.

## 2021-02-18 ENCOUNTER — Emergency Department (HOSPITAL_COMMUNITY)
Admission: EM | Admit: 2021-02-18 | Discharge: 2021-02-19 | Disposition: A | Discharge status: Home / Self Care | Payer: Self-pay | Attending: Emergency Medicine | Admitting: Emergency Medicine

## 2021-02-18 ENCOUNTER — Encounter (HOSPITAL_COMMUNITY): Payer: Self-pay | Admitting: Emergency Medicine

## 2021-02-18 ENCOUNTER — Other Ambulatory Visit: Payer: Self-pay

## 2021-02-18 DIAGNOSIS — B9689 Other specified bacterial agents as the cause of diseases classified elsewhere: Secondary | ICD-10-CM | POA: Insufficient documentation

## 2021-02-18 DIAGNOSIS — N760 Acute vaginitis: Secondary | ICD-10-CM | POA: Insufficient documentation

## 2021-02-18 LAB — WET PREP, GENITAL
Sperm: NONE SEEN
Trich, Wet Prep: NONE SEEN
Yeast Wet Prep HPF POC: NONE SEEN

## 2021-02-18 LAB — I-STAT BETA HCG BLOOD, ED (MC, WL, AP ONLY): I-stat hCG, quantitative: <5 m[IU]/mL (ref <5)

## 2021-02-18 NOTE — ED Notes (Signed)
Pelvic cart at bedside. 

## 2021-02-18 NOTE — ED Triage Notes (Signed)
Patient here from home reporting vaginal itching and swelling x3 weeks. Denies pregnancy.

## 2021-02-18 NOTE — ED Provider Notes (Signed)
[facility]-EMERGENCY DEPT Provider Note   CSN: [id number] Arrival date & time: 02/18/21  2141     History Chief Complaint  Patient presents with   Vaginal Itching    Kim Robertson is a 40 y.o. female.   40 year old female presents to the emergency department for evaluation of vaginal itching x3 weeks.  She has used multiple over-the-counter medications including clotrimazole cream, Monistat, over-the-counter herpes cream as well as a leftover prescription of metronidazole for symptoms without relief.  States that she has noted some transient swelling of her clitoris when she itches her genital area frequently.  She denies concern for STDs.  Is sexually active only with her husband.  No fevers, vomiting, urinary symptoms, vaginal bleeding, abdominal pain.  The history is provided by the patient. No language interpreter was used.  Vaginal Itching      Past Medical History:  Diagnosis Date   Gestational diabetes    Gestational diabetes mellitus (GDM), antepartum 11/21/2016   Preterm labor     Patient Active Problem List   Diagnosis Date Noted   Rash and nonspecific skin eruption 01/12/2020   Abdominal discomfort 01/12/2020   Heavy menstrual bleeding 11/06/2017    Past Surgical History:  Procedure Laterality Date   NO PAST SURGERIES       OB History     Gravida  3   Para  3   Term  2   Preterm  1   AB      Living  3      SAB      IAB      Ectopic      Multiple  0   Live Births  3           No family history on file.  Social History   Tobacco Use   Smoking status: Never   Smokeless tobacco: Never  Vaping Use   Vaping Use: Never used  Substance Use Topics   Alcohol use: No   Drug use: No    Home Medications Prior to Admission medications   Medication Sig Start Date End Date Taking? Authorizing Provider  metroNIDAZOLE (FLAGYL) 500 MG tablet Take 1 tablet (500 mg total) by mouth 2 (two) times daily. 02/19/21  Yes  Antony Madura, PA-C  famotidine (PEPCID) 20 MG tablet Take 1 tablet (20 mg total) by mouth daily. 01/09/20   Caro Laroche, DO  levocetirizine (XYZAL) 5 MG tablet Take 1 tablet (5 mg total) by mouth every evening. 01/03/20   Bing Neighbors, FNP  polyethylene glycol [facility] / GLYCOLAX) packet Take 17 g by mouth daily.    [provider]  predniSONE (DELTASONE) 20 MG tablet Take 3 PO QAM x3days, 2 PO QAM x3days, 1 PO QAM x3days 01/03/20   Bing Neighbors, FNP  triamcinolone ointment (KENALOG) 0.5 % Apply 1 application topically 2 (two) times daily. For moderate to severe eczema.  Do not use for more than 1 week at a time. 01/21/20   Garnette Gunner, MD  fexofenadine (ALLEGRA) 60 MG tablet Take 60 mg by mouth 2 (two) times daily.  01/03/20  [provider]    Allergies    Neosporin [neomycin-bacitracin zn-polymyx]  Review of Systems   Review of Systems Ten systems reviewed and are negative for acute change, except as noted in the HPI.    Physical Exam Updated Vital Signs BP 107/66 (BP Location: Left Arm)   Pulse 70   Temp 98.3 F (36.8 C) (  Oral)   Resp 16   SpO2 100%   Physical Exam Vitals and nursing note reviewed.  Constitutional:      General: She is not in acute distress.    Appearance: She is well-developed. She is not diaphoretic.     Comments: Nontoxic-appearing and in no acute distress  HENT:     Head: Normocephalic and atraumatic.  Eyes:     General: No scleral icterus.    Conjunctiva/sclera: Conjunctivae normal.  Pulmonary:     Effort: Pulmonary effort is normal. No respiratory distress.     Comments: Respirations even and unlabored Genitourinary:    Comments: White thick discharge in vaginal vault; small amount. Normal external genitalia. No cervical friability, cervical motion tenderness. Musculoskeletal:        General: Normal range of motion.     Cervical back: Normal range of motion.  Skin:    General: Skin is warm and dry.      Coloration: Skin is not pale.     Findings: No erythema or rash.  Neurological:     Mental Status: She is alert and oriented to person, place, and time.  Psychiatric:        Behavior: Behavior normal.    ED Results / Procedures / Treatments   Labs (all labs ordered are listed, but only abnormal results are displayed) Labs Reviewed  WET PREP, GENITAL - Abnormal; Notable for the following components:      Result Value   Clue Cells Wet Prep HPF POC PRESENT (*)    WBC, Wet Prep HPF POC PRESENT (*)    All other components within normal limits  URINALYSIS, ROUTINE W REFLEX MICROSCOPIC - Abnormal; Notable for the following components:   Leukocytes,Ua TRACE (*)    All other components within normal limits  I-STAT BETA HCG BLOOD, ED ([facility], [facility], [facility] ONLY)  GC/CHLAMYDIA PROBE AMP ([place]) NOT AT [facility]    EKG None  Radiology No results found.  Procedures Procedures   Medications Ordered in ED Medications  metroNIDAZOLE (FLAGYL) tablet 500 mg (500 mg Oral Given 02/19/21 0155)    ED Course  I have reviewed the triage vital signs and the nursing notes.  Pertinent labs & imaging results that were available during my care of the patient were reviewed by me and considered in my medical decision making (see chart for details).    MDM Rules/Calculators/A&P                          40 year old female presenting for 2 weeks of vaginal irritation.  Her work-up is significant for clue cells on wet prep.  Will treat for bacterial vaginosis given that patient is persistently symptomatic.  Discharged with course of Flagyl.  She does not express any concern for STIs.  GC/chlamydia pending.  Will notify if positive necessitating antibiotic therapy.  Patient given referral to outpatient OB/GYN.  Return precautions discussed and provided. Patient discharged in stable condition with no unaddressed concerns.   Final Clinical Impression(s) / ED Diagnoses Final diagnoses:  BV (bacterial vaginosis)   Acute vaginitis    Rx / DC Orders ED Discharge Orders          Ordered    metroNIDAZOLE (FLAGYL) 500 MG tablet  2 times daily        02/19/21 0056             Antony Madura, PA-C 02/19/21 0202    Molpus, Jonny Ruiz, MD 02/19/21 [phone]

## 2021-02-19 LAB — URINALYSIS, ROUTINE W REFLEX MICROSCOPIC
Bacteria, UA: NONE SEEN
Bilirubin Urine: NEGATIVE
Glucose, UA: NEGATIVE mg/dL
Hgb urine dipstick: NEGATIVE
Ketones, ur: NEGATIVE mg/dL
Nitrite: NEGATIVE
Protein, ur: NEGATIVE mg/dL
Specific Gravity, Urine: 1.018 (ref 1.005–1.030)
pH: 8 (ref 5.0–8.0)

## 2021-02-19 MED ORDER — METRONIDAZOLE 500 MG PO TABS
500.0000 mg | ORAL_TABLET | Freq: Once | ORAL | Status: AC
  Administered 2021-02-19: 500 mg via ORAL
  Filled 2021-02-19: qty 1

## 2021-02-19 MED ORDER — METRONIDAZOLE 500 MG PO TABS: 500.0000 mg | ORAL_TABLET | Freq: Two times a day (BID) | ORAL | 0 refills | Status: AC

## 2021-02-19 NOTE — Discharge Instructions (Addendum)
Take Flagyl as prescribed until finished.  We recommend follow-up with an OB/GYN.  You will be contacted if your STD tests return positive.  Return for new or concerning symptoms.

## 2021-02-21 LAB — GC/CHLAMYDIA PROBE AMP (~~LOC~~) NOT AT ARMC
Chlamydia: NEGATIVE
Comment: NEGATIVE
Comment: NORMAL
Neisseria Gonorrhea: NEGATIVE

## 2022-02-07 ENCOUNTER — Encounter: Payer: Self-pay | Admitting: *Deleted
